# Patient Record
Sex: Female | Born: 1975 | Race: White | Hispanic: No | Smoking: Never smoker
Health system: Southern US, Community
[De-identification: ages and names within clinical notes are randomized; demographics above are authoritative.]

## PROBLEM LIST (undated history)

## (undated) DIAGNOSIS — C801 Malignant (primary) neoplasm, unspecified: Secondary | ICD-10-CM

## (undated) DIAGNOSIS — B009 Herpesviral infection, unspecified: Secondary | ICD-10-CM

## (undated) DIAGNOSIS — B019 Varicella without complication: Secondary | ICD-10-CM

## (undated) HISTORY — DX: Malignant (primary) neoplasm, unspecified: C80.1

## (undated) HISTORY — DX: Varicella without complication: B01.9

## (undated) HISTORY — DX: Herpesviral infection, unspecified: B00.9

---

## 2007-02-19 ENCOUNTER — Inpatient Hospital Stay (HOSPITAL_COMMUNITY): Admission: AD | Admit: 2007-02-19 | Discharge: 2007-02-22 | Payer: Self-pay | Admitting: Obstetrics and Gynecology

## 2007-12-22 DIAGNOSIS — C801 Malignant (primary) neoplasm, unspecified: Secondary | ICD-10-CM

## 2007-12-22 HISTORY — PX: LYMPH GLAND EXCISION: SHX13

## 2007-12-22 HISTORY — DX: Malignant (primary) neoplasm, unspecified: C80.1

## 2007-12-22 HISTORY — PX: MELANOMA EXCISION: SHX5266

## 2009-07-08 ENCOUNTER — Inpatient Hospital Stay (HOSPITAL_COMMUNITY): Admission: RE | Admit: 2009-07-08 | Discharge: 2009-07-10 | Payer: Self-pay | Admitting: Obstetrics & Gynecology

## 2009-07-08 ENCOUNTER — Encounter (INDEPENDENT_AMBULATORY_CARE_PROVIDER_SITE_OTHER): Payer: Self-pay | Admitting: Obstetrics and Gynecology

## 2011-03-29 LAB — CBC
HCT: 33.5 % — ABNORMAL LOW (ref 36.0–46.0)
HCT: 37.1 % (ref 36.0–46.0)
Hemoglobin: 13.3 g/dL (ref 12.0–15.0)
MCHC: 34 g/dL (ref 30.0–36.0)
MCHC: 35.7 g/dL (ref 30.0–36.0)
MCV: 92.6 fL (ref 78.0–100.0)
MCV: 94.3 fL (ref 78.0–100.0)
Platelets: 178 10*3/uL (ref 150–400)
RBC: 4.01 MIL/uL (ref 3.87–5.11)
RDW: 12.8 % (ref 11.5–15.5)
RDW: 12.8 % (ref 11.5–15.5)
WBC: 12.2 10*3/uL — ABNORMAL HIGH (ref 4.0–10.5)

## 2011-05-05 NOTE — H&P (Signed)
Linda Fleming, Linda Fleming                ACCOUNT NO.:  [id number]   MEDICAL RECORD NO.:  [id number]          PATIENT TYPE:  INP   LOCATION:  9131                          FACILITY:  [facility]   PHYSICIAN:  Crist Fat. Rivard, M.D. DATE OF BIRTH:  [birth year]   DATE OF ADMISSION:  07/08/2009  DATE OF DISCHARGE:                              HISTORY & PHYSICAL   Dr. Dois Davenport Rivard is the doctor of record.   ADMITTING DIAGNOSIS:  Intrauterine pregnancy at 40-1/7th week with  induction of labor.   A 35 year old gravida 2, para 1 with last menstrual period October 14, 2008, equals July 07, 2009; EDD per ultrasound equals July 21, 2009,  presents for induction of labor due to cervix being 3 cm dilated in the  office and postdates.  Prenatal care began with [facility]  beginning December 24, 2008.   ADMITTING DIAGNOSIS:  1. Intrauterine pregnancy at 40 weeks for induction of labor.  2. History of HSV-2.  No outbreaks of pregnancy, history of      cryosurgery in 2002, ear melanoma with skin graft in 2009.   LABORATORY DATA:  She is B positive.  Rh antibody negative.  VDRL and  RPR negative.  Rubella immune, HBsAg negative.  HIV nonreactive.  One-  hour test was 161.  Three-hour test was performed within normal limits.  Hemoglobin at 28 weeks was 10.8 and RPR was nonreactive.  She is group B  step negative.   PAST MEDICAL HISTORY:  No known drug allergies.  Denies asthma, heart  disease, or epilepsy.   PAST SURGICAL HISTORY:  She had cryosurgery in 2002 with a colpo in  2002, skin graft for cancer, left ear for melanoma.   PAST OBSTETRICS HISTORY:  In 2008, spontaneous vaginal delivery of a  viable female infant under epidural anesthesia at 37-5/7th, 7 pounds 4  ounce baby.  Had premature rupture of membranes at term and was induced.  The baby's name is [place].   PAST FAMILY HISTORY:  Aunt had varicose veins and maternal aunt had skin  cancer.   SOCIAL HISTORY:  She is married to Eugene Gavia, who is a Psychologist, occupational at  [employer].  She works as a Runner, broadcasting/film/video.  Denies alcohol, smoking, or drugs.  She  is taking prenatal vitamins.   PHYSICAL EXAMINATION:  HEART:  Regular without murmur.  LUNGS:  Clear bilaterally.  ABDOMEN:  Gravid.  Estimated fetal weight 7-8.5 pounds.  Fetal heart  tone 140-150, positive accelerations noted.  Irregular contractions per  monitor, Pitocin induction.  GENITOURINARY:  Vaginal exam, vertex 3 cm per RN.  Fetal heart rate  tracing is doing well.   ASSESSMENT:  Intrauterine pregnancy, labor induction, admitted to  Obstetrics Service of Dr. Estanislado Pandy.  Induction per protocol, pain  medication as needed.  Anticipate delivery and consult as needed.      Jasmine Awe, CNM      Dois Davenport A. Rivard, M.D.  Electronically Signed    JM/MEDQ  D:  07/08/2009  T:  07/09/2009  Job:  [id number]

## 2011-05-08 NOTE — H&P (Signed)
Linda Fleming, Linda Fleming            ACCOUNT NO.:  [id number]   MEDICAL RECORD NO.:  [id number]          PATIENT TYPE:  MAT   LOCATION:  MATC                          [facility]ACILITY:  [facility]   PHYSICIAN:  Naima A. Dillard, M.D. DATE OF BIRTH:  [birth year]   DATE OF ADMISSION:  02/19/2007  DATE OF DISCHARGE:                              HISTORY & PHYSICAL   This is a 35 year old gravida 1, para 0, at 41-3/7 weeks, who presents  with leaking fluid since 2:30 a.m. with onset of mild contractions this  morning.  Group B strep is negative.  Pregnancy has been followed by Dr.  Su Hilt and remarkable for:   1. History of abnormal Pap with cryosurgery.  2. HSV-2.  3. Abnormal Glucola with normal 3-hour GTT.   ALLERGIES:  None.   OBSTETRICAL HISTORY:  The patient is a primigravida.   MEDICAL HISTORY:  1. Abnormal Pap with cryosurgery in 2002.  2. History of HSV, for which she has been taking Valtrex.  3. Childhood varicella.   SURGICAL HISTORY:  Remarkable for wisdom teeth.   FAMILY HISTORY:  Remarkable for an aunt with varicosities, an aunt with  skin cancer.   GENETIC HISTORY:  Negative.   SOCIAL HISTORY:  The patient is married to Tommi Emery, who is involved  and supportive.  She is of the [place] faith.  She denies any alcohol,  tobacco or drug use.   PRENATAL LABORATORY DATA:  Hemoglobin 13, platelets 332.  Blood type B  positive, antibody screen negative.  RPR nonreactive.  Rubella immune.  Hepatitis negative.  HIV negative.  Cystic fibrosis negative.   HISTORY OF CURRENT PREGNANCY:  The patient entered care at 8 weeks'  gestation.  She had an ultrasound at 19 weeks, which was normal.  She  declined a quad screen.  She had a Glucola that was abnormal at 26 weeks  but had normal 3-hour GTT.  She had group B strep negative at term.   OBJECTIVE DATA:  VITAL SIGNS:  Stable, afebrile.  HEENT:  Within normal limits.  NECK:  Thyroid not enlarged.  CHEST:  Clear to auscultation.  CARDIAC:  Regular rate and rhythm.  ABDOMEN:  Gravid.  Vertex to Leopold's.  EFM shows reactive fetal heart  rate with contractions every 2-6 minutes.  PELVIC:  Positive pooling, positive ferning, and no HSV lesions.  Cervix  is 1-2, 70%, -3 station, vertex presentation.  EXTREMITIES:  Within normal limits.   ASSESSMENT:  1. Intrauterine pregnancy at 41-3/7 weeks.  2. Preterm rupture of membranes at post term.  3. Prodromal labor.   PLAN:  1. Admit to birthing suites per Dr. Normand Sloop.  2. Routine MD orders.  3. Patient prefers expectant management as husband is out of town      until 10 p.m. tonight.      Marie L. Williams, C.N.M.      Naima A. Normand Sloop, M.D.  Electronically Signed    MLW/MEDQ  D:  02/19/2007  T:  02/19/2007  Job:  [id number]

## 2012-01-30 DIAGNOSIS — B009 Herpesviral infection, unspecified: Secondary | ICD-10-CM | POA: Insufficient documentation

## 2012-08-11 ENCOUNTER — Other Ambulatory Visit: Payer: Self-pay | Admitting: Dermatology

## 2013-05-22 LAB — HM PAP SMEAR: HM PAP: NORMAL

## 2013-08-23 ENCOUNTER — Other Ambulatory Visit: Payer: Self-pay | Admitting: Dermatology

## 2014-03-09 ENCOUNTER — Encounter: Payer: Self-pay | Admitting: Internal Medicine

## 2014-03-09 ENCOUNTER — Ambulatory Visit (INDEPENDENT_AMBULATORY_CARE_PROVIDER_SITE_OTHER): Payer: BC Managed Care – PPO | Admitting: Internal Medicine

## 2014-03-09 VITALS — BP 104/72 | HR 68 | Temp 97.9°F | Ht 64.0 in | Wt 141.5 lb

## 2014-03-09 DIAGNOSIS — Z0000 Encounter for general adult medical examination without abnormal findings: Secondary | ICD-10-CM

## 2014-03-09 LAB — CBC
HEMATOCRIT: 40.2 % (ref 36.0–46.0)
Hemoglobin: 14.2 g/dL (ref 12.0–15.0)
MCH: 30.7 pg (ref 26.0–34.0)
MCHC: 35.3 g/dL (ref 30.0–36.0)
MCV: 86.8 fL (ref 78.0–100.0)
PLATELETS: 295 10*3/uL (ref 150–400)
RBC: 4.63 MIL/uL (ref 3.87–5.11)
RDW: 12.5 % (ref 11.5–15.5)
WBC: 6.8 10*3/uL (ref 4.0–10.5)

## 2014-03-09 LAB — COMPREHENSIVE METABOLIC PANEL
ALBUMIN: 4.7 g/dL (ref 3.5–5.2)
ALT: 11 U/L (ref 0–35)
AST: 16 U/L (ref 0–37)
Alkaline Phosphatase: 44 U/L (ref 39–117)
BUN: 16 mg/dL (ref 6–23)
CALCIUM: 9.6 mg/dL (ref 8.4–10.5)
CHLORIDE: 100 meq/L (ref 96–112)
CO2: 29 meq/L (ref 19–32)
CREATININE: 0.83 mg/dL (ref 0.50–1.10)
Glucose, Bld: 82 mg/dL (ref 70–99)
POTASSIUM: 4.5 meq/L (ref 3.5–5.3)
Sodium: 136 mEq/L (ref 135–145)
Total Bilirubin: 0.3 mg/dL (ref 0.2–1.2)
Total Protein: 7.1 g/dL (ref 6.0–8.3)

## 2014-03-09 LAB — LIPID PANEL
CHOL/HDL RATIO: 2.7 ratio
CHOLESTEROL: 181 mg/dL (ref 0–200)
HDL: 68 mg/dL (ref >39)
LDL Cholesterol: 99 mg/dL (ref 0–99)
Triglycerides: 72 mg/dL (ref <150)
VLDL: 14 mg/dL (ref 0–40)

## 2014-03-09 NOTE — Progress Notes (Signed)
HPI  Pt presents to the clinic today to establish care. She did not have a PCP prior. She does go to see the gyn. She has no concerns today.  Flu: 09/2013 Tetanus: within the last 10 years LMP: mirena Pap smear: 05/2013 Dentist: biannually  Past Medical History  Diagnosis Date  . Chicken pox   . Cancer 2009    Melanoma    Current Outpatient Prescriptions  Medication Sig Dispense Refill  . levonorgestrel (MIRENA) 20 MCG/24HR IUD 1 each by Intrauterine route once.       No current facility-administered medications for this visit.    No Known Allergies  Family History  Problem Relation Age of Onset  . Hyperlipidemia Mother   . Heart disease Maternal Grandfather   . Cancer Maternal Aunt     melanoma  . Diabetes Neg Hx   . Stroke Neg Hx     History   Social History  . Marital Status: Married    Spouse Name: N/A    Number of Children: N/A  . Years of Education: N/A   Occupational History  . Not on file.   Social History Main Topics  . Smoking status: Never Smoker   . Smokeless tobacco: Not on file  . Alcohol Use: 0.6 oz/week    1 Glasses of wine per week     Comment: occasional  . Drug Use: No  . Sexual Activity: Yes   Other Topics Concern  . Not on file   Social History Narrative  . No narrative on file    ROS:  Constitutional: Denies fever, malaise, fatigue, headache or abrupt weight changes.  HEENT: Denies eye pain, eye redness, ear pain, ringing in the ears, wax buildup, runny nose, nasal congestion, bloody nose, or sore throat. Respiratory: Denies difficulty breathing, shortness of breath, cough or sputum production.   Cardiovascular: Denies chest pain, chest tightness, palpitations or swelling in the hands or feet.  Gastrointestinal: Denies abdominal pain, bloating, constipation, diarrhea or blood in the stool.  GU: Denies frequency, urgency, pain with urination, blood in urine, odor or discharge. Musculoskeletal: Denies decrease in range of  motion, difficulty with gait, muscle pain or joint pain and swelling.  Skin: Denies redness, rashes, lesions or ulcercations.  Neurological: Denies dizziness, difficulty with memory, difficulty with speech or problems with balance and coordination.   No other specific complaints in a complete review of systems (except as listed in HPI above).  PE:  BP 104/72  Pulse 68  Temp(Src) 97.9 F (36.6 C) (Oral)  Ht 5\' 4"  (1.626 m)  Wt 141 lb 8 oz (64.184 kg)  BMI 24.28 kg/m2  SpO2 98% Wt Readings from Last 3 Encounters:  03/09/14 141 lb 8 oz (64.184 kg)    General: Appears her stated age, well developed, well nourished in NAD. HEENT: Head: normal shape and size; Eyes: sclera white, no icterus, conjunctiva pink, PERRLA and EOMs intact; Ears: Tm's gray and intact, normal light reflex; Nose: mucosa pink and moist, septum midline; Throat/Mouth: Teeth present, mucosa pink and moist, no lesions or ulcerations noted.  Neck: Normal range of motion. Neck supple, trachea midline. No massses, lumps or thyromegaly present.  Cardiovascular: Normal rate and rhythm. S1,S2 noted.  No murmur, rubs or gallops noted. No JVD or BLE edema. No carotid bruits noted. Pulmonary/Chest: Normal effort and positive vesicular breath sounds. No respiratory distress. No wheezes, rales or ronchi noted.  Abdomen: Soft and nontender. Normal bowel sounds, no bruits noted. No distention or masses noted. Liver,  spleen and kidneys non palpable. Musculoskeletal: Normal range of motion. No signs of joint swelling. No difficulty with gait.  Neurological: Alert and oriented. Cranial nerves II-XII intact. Coordination normal. +DTRs bilaterally. Psychiatric: Mood and affect normal. Behavior is normal. Judgment and thought content normal.   EKG:  BMET No results found for this basename: na, k, cl, co2, glucose, bun, creatinine, calcium, gfrnonaa, gfraa    Lipid Panel  No results found for this basename: chol, trig, hdl, cholhdl,  vldl, ldlcalc    CBC    Component Value Date/Time   WBC 12.2* 07/09/2009 0815   RBC 3.55* 07/09/2009 0815   HGB 11.4* 07/09/2009 0815   HCT 33.5* 07/09/2009 0815   PLT 178 07/09/2009 0815   MCV 94.3 07/09/2009 0815   MCHC 34.0 07/09/2009 0815   RDW 12.8 07/09/2009 0815    Hgb A1C No results found for this basename: HGBA1C     Assessment and Plan:  Preventative Health Maintenance:  All HM UTD Will obtain basic screening labs today  RTC in 1 year or sooner if needed

## 2014-03-09 NOTE — Patient Instructions (Addendum)

## 2014-03-09 NOTE — Progress Notes (Signed)
Pre visit review using our clinic review tool, if applicable. No additional management support is needed unless otherwise documented below in the visit note. 

## 2014-03-12 ENCOUNTER — Encounter: Payer: Self-pay | Admitting: *Deleted

## 2014-06-13 ENCOUNTER — Ambulatory Visit (INDEPENDENT_AMBULATORY_CARE_PROVIDER_SITE_OTHER): Payer: BC Managed Care – PPO | Admitting: Internal Medicine

## 2014-06-13 ENCOUNTER — Encounter (INDEPENDENT_AMBULATORY_CARE_PROVIDER_SITE_OTHER): Payer: Self-pay

## 2014-06-13 ENCOUNTER — Encounter: Payer: Self-pay | Admitting: Internal Medicine

## 2014-06-13 VITALS — BP 104/66 | HR 60 | Temp 98.0°F | Wt 137.0 lb

## 2014-06-13 DIAGNOSIS — R0982 Postnasal drip: Secondary | ICD-10-CM

## 2014-06-13 DIAGNOSIS — R059 Cough, unspecified: Secondary | ICD-10-CM

## 2014-06-13 DIAGNOSIS — R05 Cough: Secondary | ICD-10-CM

## 2014-06-13 MED ORDER — HYDROCODONE-HOMATROPINE 5-1.5 MG/5ML PO SYRP: 5.0000 mL | ORAL_SOLUTION | Freq: Three times a day (TID) | ORAL | Status: DC | PRN

## 2014-06-13 NOTE — Progress Notes (Signed)
HPI  Pt presents to the clinic today with c/o cough. This started 5 days ago. The cough is dry and non productive. The cough does seem to be worse at night. She denies runny nose, fever, chills or body aches. She has not tried anything OTC. She has no history of allergies or breathing problems. She has not had sick contacts that she is aware of.  Review of Systems      Past Medical History  Diagnosis Date  . Chicken pox   . Cancer 2009    Melanoma    Family History  Problem Relation Age of Onset  . Hyperlipidemia Mother   . Heart disease Maternal Grandfather   . Cancer Maternal Aunt     melanoma  . Diabetes Neg Hx   . Stroke Neg Hx     History   Social History  . Marital Status: Married    Spouse Name: N/A    Number of Children: N/A  . Years of Education: N/A   Occupational History  . Not on file.   Social History Main Topics  . Smoking status: Never Smoker   . Smokeless tobacco: Not on file  . Alcohol Use: 0.6 oz/week    1 Glasses of wine per week     Comment: occasional  . Drug Use: No  . Sexual Activity: Yes   Other Topics Concern  . Not on file   Social History Narrative  . No narrative on file    No Known Allergies   Constitutional:  Denies headache, fatigue, fever or abrupt weight changes.  HEENT:  Denies eye redness, eye pain, pressure behind the eyes, facial pain, nasal congestion, ear pain, ringing in the ears, wax buildup, runny nose or bloody nose. Respiratory: Positive cough. Denies difficulty breathing or shortness of breath.  Cardiovascular: Denies chest pain, chest tightness, palpitations or swelling in the hands or feet.   No other specific complaints in a complete review of systems (except as listed in HPI above).  Objective:   BP 104/66  Pulse 60  Temp(Src) 98 F (36.7 C) (Oral)  Wt 137 lb (62.143 kg)  SpO2 98% Wt Readings from Last 3 Encounters:  06/13/14 137 lb (62.143 kg)  03/09/14 141 lb 8 oz (64.184 kg)     General:  Appears her stated age, well developed, well nourished in NAD. HEENT: Head: normal shape and size; Eyes: sclera white, no icterus, conjunctiva pink, PERRLA and EOMs intact; Ears: Tm's gray and intact, normal light reflex; Nose: mucosa pink and moist, septum midline; Throat/Mouth: + PND. Teeth present, mucosa pink and moist, no exudate noted, no lesions or ulcerations noted.  Neck: Neck supple, trachea midline. No massses, lumps or thyromegaly present.  Cardiovascular: Normal rate and rhythm. S1,S2 noted.  No murmur, rubs or gallops noted. No JVD or BLE edema. No carotid bruits noted. Pulmonary/Chest: Normal effort and positive vesicular breath sounds. No respiratory distress. No wheezes, rales or ronchi noted.      Assessment & Plan:   Cough secondary to post nasal drip:  Get some rest and drink plenty of water Try some Allegra and Flonase OTC RX given for Hycodan cough syrup   RTC as needed or if symptoms persist.

## 2014-06-13 NOTE — Patient Instructions (Addendum)
Cough, Adult  A cough is a reflex that helps clear your throat and airways. It can help heal the body or may be a reaction to an irritated airway. A cough may only last 2 or 3 weeks (acute) or may last more than 8 weeks (chronic).  CAUSES Acute cough:  Viral or bacterial infections. Chronic cough:  Infections.  Allergies.  Asthma.  Post-nasal drip.  Smoking.  Heartburn or acid reflux.  Some medicines.  Chronic lung problems (COPD).  Cancer. SYMPTOMS   Cough.  Fever.  Chest pain.  Increased breathing rate.  High-pitched whistling sound when breathing (wheezing).  Colored mucus that you cough up (sputum). TREATMENT   A bacterial cough may be treated with antibiotic medicine.  A viral cough must run its course and will not respond to antibiotics.  Your caregiver may recommend other treatments if you have a chronic cough. HOME CARE INSTRUCTIONS   Only take over-the-counter or prescription medicines for pain, discomfort, or fever as directed by your caregiver. Use cough suppressants only as directed by your caregiver.  Use a cold steam vaporizer or humidifier in your bedroom or home to help loosen secretions.  Sleep in a semi-upright position if your cough is worse at night.  Rest as needed.  Stop smoking if you smoke. SEEK IMMEDIATE MEDICAL CARE IF:   You have pus in your sputum.  Your cough starts to worsen.  You cannot control your cough with suppressants and are losing sleep.  You begin coughing up blood.  You have difficulty breathing.  You develop pain which is getting worse or is uncontrolled with medicine.  You have a fever. MAKE SURE YOU:   Understand these instructions.  Will watch your condition.  Will get help right away if you are not doing well or get worse. Document Released: 06/05/2011 Document Revised: 02/29/2012 Document Reviewed: 06/05/2011 ExitCare Patient Information 2015 [employer]. This information is not intended  to replace advice given to you by your health care provider. Make sure you discuss any questions you have with your health care provider.  

## 2014-06-13 NOTE — Progress Notes (Signed)
Pre visit review using our clinic review tool, if applicable. No additional management support is needed unless otherwise documented below in the visit note. 

## 2014-06-14 ENCOUNTER — Ambulatory Visit: Payer: BC Managed Care – PPO | Admitting: Internal Medicine

## 2014-08-29 ENCOUNTER — Other Ambulatory Visit: Payer: Self-pay | Admitting: Dermatology

## 2014-11-02 ENCOUNTER — Ambulatory Visit (INDEPENDENT_AMBULATORY_CARE_PROVIDER_SITE_OTHER): Payer: BC Managed Care – PPO | Admitting: Family Medicine

## 2014-11-02 ENCOUNTER — Encounter: Payer: Self-pay | Admitting: Family Medicine

## 2014-11-02 VITALS — BP 98/66 | HR 64 | Temp 98.2°F | Resp 16 | Wt 144.5 lb

## 2014-11-02 DIAGNOSIS — N3000 Acute cystitis without hematuria: Secondary | ICD-10-CM

## 2014-11-02 DIAGNOSIS — R300 Dysuria: Secondary | ICD-10-CM

## 2014-11-02 LAB — POCT URINALYSIS DIPSTICK
BILIRUBIN UA: NEGATIVE
Blood, UA: NEGATIVE
GLUCOSE UA: NEGATIVE
KETONES UA: NEGATIVE
Nitrite, UA: NEGATIVE
PROTEIN UA: NEGATIVE
SPEC GRAV UA: 1.02
Urobilinogen, UA: 0.2
pH, UA: 6

## 2014-11-02 MED ORDER — SULFAMETHOXAZOLE-TRIMETHOPRIM 800-160 MG PO TABS: 1.0000 | ORAL_TABLET | Freq: Two times a day (BID) | ORAL | Status: DC

## 2014-11-02 NOTE — Patient Instructions (Signed)
Drink plenty of water and start the antibiotics today.  We'll contact you with your lab report.  Take care.   Try to get some rest.

## 2014-11-02 NOTE — Progress Notes (Signed)
Pre visit review using our clinic review tool, if applicable. No additional management support is needed unless otherwise documented below in the visit note.  Dysuria: noted change in odor, some burning, last few days.   abdominal pain: occ lower abd pain Fevers: no back pain: no Vomiting: no  Recent URI sx.  Voice is affected.  Cold started about 1 week ago.  Thick nasal discharge, facial pain.  No ear pain.  Some ST initially, but not now.   Some clicking in the L ear with swallowing.  Some cough, some sputum.    Meds, vitals, and allergies reviewed.   ROS: See HPI.  Otherwise negative.    GEN: nad, alert and oriented HEENT: mucous membranes moist, TM wnl, normal TM movement on valsalva, sinuses not ttp, OP with minimal cobblestoning.  NECK: supple CV: rrr.  PULM: ctab, no inc wob ABD: soft, +bs, suprapubic area mildly tender EXT: no edema SKIN: no acute rash BACK: no CVA pain

## 2014-11-04 DIAGNOSIS — N390 Urinary tract infection, site not specified: Secondary | ICD-10-CM | POA: Insufficient documentation

## 2014-11-04 NOTE — Assessment & Plan Note (Signed)
Nontoxic, septra, ucx, fluids, f/u prn.  Also concurrent URI, nontoxic, supportive care; will be on septra for UTI.   Await culture.   D/w pt.  She agrees.

## 2014-11-05 LAB — URINE CULTURE

## 2015-02-12 ENCOUNTER — Ambulatory Visit (INDEPENDENT_AMBULATORY_CARE_PROVIDER_SITE_OTHER): Payer: BC Managed Care – PPO | Admitting: Internal Medicine

## 2015-02-12 ENCOUNTER — Encounter: Payer: Self-pay | Admitting: Internal Medicine

## 2015-02-12 VITALS — BP 102/60 | HR 65 | Temp 98.4°F | Wt 142.0 lb

## 2015-02-12 DIAGNOSIS — A499 Bacterial infection, unspecified: Secondary | ICD-10-CM

## 2015-02-12 DIAGNOSIS — B373 Candidiasis of vulva and vagina: Secondary | ICD-10-CM

## 2015-02-12 DIAGNOSIS — B9689 Other specified bacterial agents as the cause of diseases classified elsewhere: Secondary | ICD-10-CM

## 2015-02-12 DIAGNOSIS — B3731 Acute candidiasis of vulva and vagina: Secondary | ICD-10-CM

## 2015-02-12 DIAGNOSIS — N760 Acute vaginitis: Secondary | ICD-10-CM

## 2015-02-12 MED ORDER — FLUCONAZOLE 150 MG PO TABS
150.0000 mg | ORAL_TABLET | Freq: Once | ORAL | Status: DC
Start: 2015-02-12 — End: 2015-03-12

## 2015-02-12 MED ORDER — METRONIDAZOLE 500 MG PO TABS: 500.0000 mg | ORAL_TABLET | Freq: Three times a day (TID) | ORAL | Status: DC

## 2015-02-12 NOTE — Patient Instructions (Signed)
Bacterial Vaginosis Bacterial vaginosis is a vaginal infection that occurs when the normal balance of bacteria in the vagina is disrupted. It results from an overgrowth of certain bacteria. This is the most common vaginal infection in women of childbearing age. Treatment is important to prevent complications, especially in pregnant women, as it can cause a premature delivery. CAUSES  Bacterial vaginosis is caused by an increase in harmful bacteria that are normally present in smaller amounts in the vagina. Several different kinds of bacteria can cause bacterial vaginosis. However, the reason that the condition develops is not fully understood. RISK FACTORS Certain activities or behaviors can put you at an increased risk of developing bacterial vaginosis, including:  Having a new sex partner or multiple sex partners.  Douching.  Using an intrauterine device (IUD) for contraception. Women do not get bacterial vaginosis from toilet seats, bedding, swimming pools, or contact with objects around them. SIGNS AND SYMPTOMS  Some women with bacterial vaginosis have no signs or symptoms. Common symptoms include:  Grey vaginal discharge.  A fishlike odor with discharge, especially after sexual intercourse.  Itching or burning of the vagina and vulva.  Burning or pain with urination. DIAGNOSIS  Your health care provider will take a medical history and examine the vagina for signs of bacterial vaginosis. A sample of vaginal fluid may be taken. Your health care provider will look at this sample under a microscope to check for bacteria and abnormal cells. A vaginal pH test may also be done.  TREATMENT  Bacterial vaginosis may be treated with antibiotic medicines. These may be given in the form of a pill or a vaginal cream. A second round of antibiotics may be prescribed if the condition comes back after treatment.  HOME CARE INSTRUCTIONS   Only take over-the-counter or prescription medicines as  directed by your health care provider.  If antibiotic medicine was prescribed, take it as directed. Make sure you finish it even if you start to feel better.  Do not have sex until treatment is completed.  Tell all sexual partners that you have a vaginal infection. They should see their health care provider and be treated if they have problems, such as a mild rash or itching.  Practice safe sex by using condoms and only having one sex partner. SEEK MEDICAL CARE IF:   Your symptoms are not improving after 3 days of treatment.  You have increased discharge or pain.  You have a fever. MAKE SURE YOU:   Understand these instructions.  Will watch your condition.  Will get help right away if you are not doing well or get worse. FOR MORE INFORMATION  Centers for Disease Control and Prevention, Division of STD Prevention: www.cdc.gov/std American Sexual Health Association (ASHA): www.ashastd.org  Document Released: 12/07/2005 Document Revised: 09/27/2013 Document Reviewed: 07/19/2013 ExitCare Patient Information 2015 [employer]. This information is not intended to replace advice given to you by your health care provider. Make sure you discuss any questions you have with your health care provider.  

## 2015-02-12 NOTE — Progress Notes (Signed)
Pre visit review using our clinic review tool, if applicable. No additional management support is needed unless otherwise documented below in the visit note. 

## 2015-02-12 NOTE — Progress Notes (Signed)
Subjective:    Patient ID: Linda Fleming, female    DOB: 12/30/1975, 39 y.o.   MRN: [id number]  HPI  Pt presents to the clinic today with c/o vaginal discharge. She reports this started 1 week ago. She describes the discharge as thin and white with a slight odor. She denies itching, burning or abnormal bleeding. She has not tried anything OTC. She has had BV in the past and things this is similar. She is sexually active. She has an IUD.  Review of Systems      Past Medical History  Diagnosis Date  . Chicken pox   . Cancer 2009    Melanoma    Current Outpatient Prescriptions  Medication Sig Dispense Refill  . acyclovir (ZOVIRAX) 400 MG tablet     . levonorgestrel (MIRENA) 20 MCG/24HR IUD 1 each by Intrauterine route once.     No current facility-administered medications for this visit.    No Known Allergies  Family History  Problem Relation Age of Onset  . Hyperlipidemia Mother   . Heart disease Maternal Grandfather   . Cancer Maternal Aunt     melanoma  . Diabetes Neg Hx   . Stroke Neg Hx     History   Social History  . Marital Status: Married    Spouse Name: N/A  . Number of Children: N/A  . Years of Education: N/A   Occupational History  . Not on file.   Social History Main Topics  . Smoking status: Never Smoker   . Smokeless tobacco: Not on file  . Alcohol Use: 0.6 oz/week    1 Glasses of wine per week     Comment: occasional  . Drug Use: No  . Sexual Activity: Yes   Other Topics Concern  . Not on file   Social History Narrative     Constitutional: Denies fever, malaise, fatigue, headache or abrupt weight changes.  Respiratory: Denies difficulty breathing, shortness of breath, cough or sputum production.   Cardiovascular: Denies chest pain, chest tightness, palpitations or swelling in the hands or feet.  Gastrointestinal: Denies abdominal pain, bloating, constipation, diarrhea or blood in the stool.  GU: Pt reports vaginal discharge. Denies  urgency, frequency, pain with urination, burning sensation, blood in urine. Skin: Denies redness, rashes, lesions or ulcercations.   No other specific complaints in a complete review of systems (except as listed in HPI above).  Objective:   Physical Exam  BP 102/60 mmHg  Pulse 65  Temp(Src) 98.4 F (36.9 C) (Oral)  Wt 142 lb (64.411 kg)  SpO2 99% Wt Readings from Last 3 Encounters:  02/12/15 142 lb (64.411 kg)  11/02/14 144 lb 8 oz (65.545 kg)  06/13/14 137 lb (62.143 kg)    General: Appears her stated age, well developed, well nourished in NAD. Skin: Warm, dry and intact. No rashes, lesions or ulcerations noted. Cardiovascular: Normal rate and rhythm. S1,S2 noted.  No murmur, rubs or gallops noted. Pulmonary/Chest: Normal effort and positive vesicular breath sounds. No respiratory distress. No wheezes, rales or ronchi noted.  Abdomen: Soft and nontender. Normal bowel sounds, no bruits noted. No distention or masses noted. Liver, spleen and kidneys non palpable. Pelvic: Normal female anatomy. Small amount of thin white discharge noted at vaginal opening. + odor.  BMET    Component Value Date/Time   NA 136 03/09/2014 1545   K 4.5 03/09/2014 1545   CL 100 03/09/2014 1545   CO2 29 03/09/2014 1545   GLUCOSE 82 03/09/2014 1545  BUN 16 03/09/2014 1545   CREATININE 0.83 03/09/2014 1545   CALCIUM 9.6 03/09/2014 1545    Lipid Panel     Component Value Date/Time   CHOL 181 03/09/2014 1545   TRIG 72 03/09/2014 1545   HDL 68 03/09/2014 1545   CHOLHDL 2.7 03/09/2014 1545   VLDL 14 03/09/2014 1545   LDLCALC 99 03/09/2014 1545    CBC    Component Value Date/Time   WBC 6.8 03/09/2014 1545   RBC 4.63 03/09/2014 1545   HGB 14.2 03/09/2014 1545   HCT 40.2 03/09/2014 1545   PLT 295 03/09/2014 1545   MCV 86.8 03/09/2014 1545   MCH 30.7 03/09/2014 1545   MCHC 35.3 03/09/2014 1545   RDW 12.5 03/09/2014 1545    Hgb A1C No results found for: HGBA1C      Assessment &  Plan:   Vaginal Discharge secondary to BV and Candida Vaginitis:  Wet prep: + whiff, + clue cells, + yeast eRx for Flagyl 500 mg BID x 7 days- advised her to abstain from alcohol eRx for Diflucan 150 mg PO x 1  RTC as needed or if symptoms persist or worsen

## 2015-03-12 ENCOUNTER — Encounter: Payer: Self-pay | Admitting: Internal Medicine

## 2015-03-12 ENCOUNTER — Ambulatory Visit (INDEPENDENT_AMBULATORY_CARE_PROVIDER_SITE_OTHER): Payer: BC Managed Care – PPO | Admitting: Internal Medicine

## 2015-03-12 VITALS — BP 106/68 | HR 67 | Temp 98.0°F | Wt 143.0 lb

## 2015-03-12 DIAGNOSIS — A499 Bacterial infection, unspecified: Secondary | ICD-10-CM

## 2015-03-12 DIAGNOSIS — N760 Acute vaginitis: Secondary | ICD-10-CM

## 2015-03-12 DIAGNOSIS — B9689 Other specified bacterial agents as the cause of diseases classified elsewhere: Secondary | ICD-10-CM

## 2015-03-12 MED ORDER — METRONIDAZOLE 500 MG PO TABS: 500.0000 mg | ORAL_TABLET | Freq: Two times a day (BID) | ORAL | Status: DC

## 2015-03-12 NOTE — Progress Notes (Signed)
Subjective:    Patient ID: Linda Fleming, female    DOB: [birth year], 39 y.o.   MRN: [id number]  HPI  Pt presents to the clinic today with c/o vaginal discharge and odor. She reports this started yesterday. The discharge is thin and white. She denies vaginal itching. She is sexually active. She was seen 1 month ago for the same and diagnosed with BV and a yeast infection. Her symptoms at that time did go away with the treatment provided.   Review of Systems  Past Medical History  Diagnosis Date  . Chicken pox   . Cancer 2009    Melanoma    Current Outpatient Prescriptions  Medication Sig Dispense Refill  . levonorgestrel (MIRENA) 20 MCG/24HR IUD 1 each by Intrauterine route once.     No current facility-administered medications for this visit.    No Known Allergies  Family History  Problem Relation Age of Onset  . Hyperlipidemia Mother   . Heart disease Maternal Grandfather   . Cancer Maternal Aunt     melanoma  . Diabetes Neg Hx   . Stroke Neg Hx     History   Social History  . Marital Status: Married    Spouse Name: N/A  . Number of Children: N/A  . Years of Education: N/A   Occupational History  . Not on file.   Social History Main Topics  . Smoking status: Never Smoker   . Smokeless tobacco: Not on file  . Alcohol Use: 0.6 oz/week    1 Glasses of wine per week     Comment: occasional  . Drug Use: No  . Sexual Activity: Yes   Other Topics Concern  . Not on file   Social History Narrative     Constitutional: Denies fever, malaise, fatigue, headache or abrupt weight changes.  Respiratory: Denies difficulty breathing, shortness of breath, cough or sputum production.   Cardiovascular: Denies chest pain, chest tightness, palpitations or swelling in the hands or feet.  Gastrointestinal: Denies abdominal pain, bloating, constipation, diarrhea or blood in the stool.  GU: Pt reports vaginal discharge and odor. Denies urgency, frequency, pain with  urination, burning sensation, blood in urine.   No other specific complaints in a complete review of systems (except as listed in HPI above).     Objective:   Physical Exam   BP 106/68 mmHg  Pulse 67  Temp(Src) 98 F (36.7 C) (Oral)  Wt 143 lb (64.864 kg)  SpO2 99% Wt Readings from Last 3 Encounters:  03/12/15 143 lb (64.864 kg)  02/12/15 142 lb (64.411 kg)  11/02/14 144 lb 8 oz (65.545 kg)    General: Appears herstated age, well developed, well nourished in NAD. Cardiovascular: Normal rate and rhythm. S1,S2 noted.  No murmur, rubs or gallops noted. Pulmonary/Chest: Normal effort and positive vesicular breath sounds. No respiratory distress. No wheezes, rales or ronchi noted.  Abdomen: Soft and nontender. Normal bowel sounds, no bruits noted. No distention or masses noted. Liver, spleen and kidneys non palpable. Pelvic: Normal female anatomy. Small amount of discharge noted at vaginal opening.  BMET    Component Value Date/Time   NA 136 03/09/2014 1545   K 4.5 03/09/2014 1545   CL 100 03/09/2014 1545   CO2 29 03/09/2014 1545   GLUCOSE 82 03/09/2014 1545   BUN 16 03/09/2014 1545   CREATININE 0.83 03/09/2014 1545   CALCIUM 9.6 03/09/2014 1545    Lipid Panel     Component Value Date/Time  CHOL 181 03/09/2014 1545   TRIG 72 03/09/2014 1545   HDL 68 03/09/2014 1545   CHOLHDL 2.7 03/09/2014 1545   VLDL 14 03/09/2014 1545   LDLCALC 99 03/09/2014 1545    CBC    Component Value Date/Time   WBC 6.8 03/09/2014 1545   RBC 4.63 03/09/2014 1545   HGB 14.2 03/09/2014 1545   HCT 40.2 03/09/2014 1545   PLT 295 03/09/2014 1545   MCV 86.8 03/09/2014 1545   MCH 30.7 03/09/2014 1545   MCHC 35.3 03/09/2014 1545   RDW 12.5 03/09/2014 1545    Hgb A1C No results found for: HGBA1C      Assessment & Plan:   Recurrent Bacterial Vagonosis:  Wet prep: + clue cells, no yeast, no trich Rx for Flagyl 500 mg BID x 14 days Will put refills on it for her as this is a  recurrent issue Advised her to avoid alcohol while on the medication  RTC as needed or if symptoms persist or worsen

## 2015-03-12 NOTE — Patient Instructions (Signed)
Bacterial Vaginosis Bacterial vaginosis is a vaginal infection that occurs when the normal balance of bacteria in the vagina is disrupted. It results from an overgrowth of certain bacteria. This is the most common vaginal infection in women of childbearing age. Treatment is important to prevent complications, especially in pregnant women, as it can cause a premature delivery. CAUSES  Bacterial vaginosis is caused by an increase in harmful bacteria that are normally present in smaller amounts in the vagina. Several different kinds of bacteria can cause bacterial vaginosis. However, the reason that the condition develops is not fully understood. RISK FACTORS Certain activities or behaviors can put you at an increased risk of developing bacterial vaginosis, including:  Having a new sex partner or multiple sex partners.  Douching.  Using an intrauterine device (IUD) for contraception. Women do not get bacterial vaginosis from toilet seats, bedding, swimming pools, or contact with objects around them. SIGNS AND SYMPTOMS  Some women with bacterial vaginosis have no signs or symptoms. Common symptoms include:  Grey vaginal discharge.  A fishlike odor with discharge, especially after sexual intercourse.  Itching or burning of the vagina and vulva.  Burning or pain with urination. DIAGNOSIS  Your health care provider will take a medical history and examine the vagina for signs of bacterial vaginosis. A sample of vaginal fluid may be taken. Your health care provider will look at this sample under a microscope to check for bacteria and abnormal cells. A vaginal pH test may also be done.  TREATMENT  Bacterial vaginosis may be treated with antibiotic medicines. These may be given in the form of a pill or a vaginal cream. A second round of antibiotics may be prescribed if the condition comes back after treatment.  HOME CARE INSTRUCTIONS   Only take over-the-counter or prescription medicines as  directed by your health care provider.  If antibiotic medicine was prescribed, take it as directed. Make sure you finish it even if you start to feel better.  Do not have sex until treatment is completed.  Tell all sexual partners that you have a vaginal infection. They should see their health care provider and be treated if they have problems, such as a mild rash or itching.  Practice safe sex by using condoms and only having one sex partner. SEEK MEDICAL CARE IF:   Your symptoms are not improving after 3 days of treatment.  You have increased discharge or pain.  You have a fever. MAKE SURE YOU:   Understand these instructions.  Will watch your condition.  Will get help right away if you are not doing well or get worse. FOR MORE INFORMATION  Centers for Disease Control and Prevention, Division of STD Prevention: www.cdc.gov/std American Sexual Health Association (ASHA): www.ashastd.org  Document Released: 12/07/2005 Document Revised: 09/27/2013 Document Reviewed: 07/19/2013 ExitCare Patient Information 2015 [employer]. This information is not intended to replace advice given to you by your health care provider. Make sure you discuss any questions you have with your health care provider.  

## 2015-03-12 NOTE — Progress Notes (Signed)
Pre visit review using our clinic review tool, if applicable. No additional management support is needed unless otherwise documented below in the visit note. 

## 2015-05-23 ENCOUNTER — Other Ambulatory Visit: Payer: Self-pay | Admitting: Family Medicine

## 2015-06-28 ENCOUNTER — Other Ambulatory Visit: Payer: Self-pay | Admitting: Internal Medicine

## 2015-07-01 NOTE — Telephone Encounter (Signed)
Last filled 03/12/2015 with 2 refills--please advise or does pt need to come for an OV

## 2015-09-16 ENCOUNTER — Telehealth: Payer: Self-pay | Admitting: Internal Medicine

## 2015-09-16 NOTE — Telephone Encounter (Signed)
Rec'd from [facility] forward 6 pages to Dr. Garnette Gunner

## 2015-10-25 ENCOUNTER — Encounter: Payer: Self-pay | Admitting: Family Medicine

## 2015-10-25 ENCOUNTER — Ambulatory Visit (INDEPENDENT_AMBULATORY_CARE_PROVIDER_SITE_OTHER): Payer: BC Managed Care – PPO | Admitting: Family Medicine

## 2015-10-25 VITALS — BP 108/64 | HR 61 | Temp 98.3°F | Wt 141.8 lb

## 2015-10-25 DIAGNOSIS — R300 Dysuria: Secondary | ICD-10-CM | POA: Diagnosis not present

## 2015-10-25 LAB — POCT URINALYSIS DIPSTICK
BILIRUBIN UA: NEGATIVE
GLUCOSE UA: NEGATIVE
Ketones, UA: NEGATIVE
NITRITE UA: NEGATIVE
PH UA: 5
Protein, UA: NEGATIVE
RBC UA: NEGATIVE
SPEC GRAV UA: 1.01
Urobilinogen, UA: 0.2

## 2015-10-25 LAB — URINALYSIS, MICROSCOPIC ONLY: RBC / HPF: NONE SEEN (ref 0–?)

## 2015-10-25 MED ORDER — CEPHALEXIN 500 MG PO CAPS: 500.0000 mg | ORAL_CAPSULE | Freq: Two times a day (BID) | ORAL | Status: DC

## 2015-10-25 NOTE — Progress Notes (Signed)
Pre visit review using our clinic review tool, if applicable. No additional management support is needed unless otherwise documented below in the visit note. 

## 2015-10-25 NOTE — Assessment & Plan Note (Signed)
History and UA consistent with UTI. Patient is well-appearing and vitals are stable. We'll treat with Keflex. We'll send for culture and micro-. She is given return precautions.

## 2015-10-25 NOTE — Patient Instructions (Signed)
Nice to meet you. You have a UTI. We will send this for culture to confirm.  We will treat you with keflex for this.  If you develop abdominal pain, nausea, vomiting, diarrhea, fever, back pain, or new or worsening symptoms please seek medical attention.

## 2015-10-25 NOTE — Progress Notes (Signed)
Patient ID: Linda Fleming, female   DOB: 29-Apr-1976, 39 y.o.   MRN: [id number]  Tommi Rumps, MD Phone: [phone]  Linda Fleming is a 39 y.o. female who presents today for same day visit.   Dysuria: Patient notes since Sunday she's had burning with urination and odor to her urine. She notes some urgency and frequency. She denies any hematuria, abdominal pain, fever, vaginal discharge, and vaginal bleeding. No back pain with this. She notes this feels like her prior UTIs. She feels well at this time.  PMH: nonsmoker. Has history of prior UTI with Citrobacter.   ROS see history of present illness  Objective  Physical Exam Filed Vitals:   10/25/15 1410  BP: 108/64  Pulse: 61  Temp: 98.3 F (36.8 C)    Physical Exam  Constitutional: She is well-developed, well-nourished, and in no distress.  HENT:  Head: Normocephalic and atraumatic.  Cardiovascular: Normal rate, regular rhythm and normal heart sounds.  Exam reveals no gallop and no friction rub.   No murmur heard. Pulmonary/Chest: Effort normal and breath sounds normal. No respiratory distress. She has no wheezes. She has no rales.  Abdominal: Soft. Bowel sounds are normal. She exhibits no distension. There is no tenderness. There is no rebound and no guarding.  Skin: Skin is warm and dry. She is not diaphoretic.     Assessment/Plan: Please see individual problem list.  UTI (urinary tract infection) History and UA consistent with UTI. Patient is well-appearing and vitals are stable. We'll treat with Keflex. We'll send for culture and micro-. She is given return precautions.    Orders Placed This Encounter  Procedures  . Urine Culture  . Urine Microscopic Only  . POCT Urinalysis Dipstick    Meds ordered this encounter  Medications  . cephALEXin (KEFLEX) 500 MG capsule    Sig: Take 1 capsule (500 mg total) by mouth 2 (two) times daily.    Dispense:  14 capsule    Refill:  0    Tommi Rumps

## 2015-10-27 LAB — URINE CULTURE

## 2016-06-29 ENCOUNTER — Encounter: Payer: Self-pay | Admitting: Internal Medicine

## 2016-06-29 ENCOUNTER — Ambulatory Visit (INDEPENDENT_AMBULATORY_CARE_PROVIDER_SITE_OTHER): Payer: BC Managed Care – PPO | Admitting: Internal Medicine

## 2016-06-29 VITALS — BP 104/62 | HR 66 | Temp 98.7°F | Wt 144.0 lb

## 2016-06-29 DIAGNOSIS — L739 Follicular disorder, unspecified: Secondary | ICD-10-CM | POA: Diagnosis not present

## 2016-06-29 NOTE — Progress Notes (Signed)
Pre visit review using our clinic review tool, if applicable. No additional management support is needed unless otherwise documented below in the visit note. 

## 2016-06-29 NOTE — Patient Instructions (Signed)
Folliculitis °Folliculitis is redness, soreness, and swelling (inflammation) of the hair follicles. This condition can occur anywhere on the body. People with weakened immune systems, diabetes, or obesity have a greater risk of getting folliculitis. °CAUSES °· Bacterial infection. This is the most common cause. °· Fungal infection. °· Viral infection. °· Contact with certain chemicals, especially oils and tars. °Long-term folliculitis can result from bacteria that live in the nostrils. The bacteria may trigger multiple outbreaks of folliculitis over time. °SYMPTOMS °Folliculitis most commonly occurs on the scalp, thighs, legs, back, buttocks, and areas where hair is shaved frequently. An early sign of folliculitis is a small, white or yellow, pus-filled, itchy lesion (pustule). These lesions appear on a red, inflamed follicle. They are usually less than 0.2 inches (5 mm) wide. When there is an infection of the follicle that goes deeper, it becomes a boil or furuncle. A group of closely packed boils creates a larger lesion (carbuncle). Carbuncles tend to occur in hairy, sweaty areas of the body. °DIAGNOSIS  °Your caregiver can usually tell what is wrong by doing a physical exam. A sample may be taken from one of the lesions and tested in a lab. This can help determine what is causing your folliculitis. °TREATMENT  °Treatment may include: °· Applying warm compresses to the affected areas. °· Taking antibiotic medicines orally or applying them to the skin. °· Draining the lesions if they contain a large amount of pus or fluid. °· Laser hair removal for cases of long-lasting folliculitis. This helps to prevent regrowth of the hair. °HOME CARE INSTRUCTIONS °· Apply warm compresses to the affected areas as directed by your caregiver. °· If antibiotics are prescribed, take them as directed. Finish them even if you start to feel better. °· You may take over-the-counter medicines to relieve itching. °· Do not shave irritated  skin. °· Follow up with your caregiver as directed. °SEEK IMMEDIATE MEDICAL CARE IF:  °· You have increasing redness, swelling, or pain in the affected area. °· You have a fever. °MAKE SURE YOU: °· Understand these instructions. °· Will watch your condition. °· Will get help right away if you are not doing well or get worse. °  °This information is not intended to replace advice given to you by your health care provider. Make sure you discuss any questions you have with your health care provider. °  °Document Released: 02/15/2002 Document Revised: 12/28/2014 Document Reviewed: 03/08/2012 °Elsevier Interactive Patient Education ©2016 [employer] ° °

## 2016-06-29 NOTE — Progress Notes (Signed)
Subjective:    Patient ID: Linda Fleming, female    DOB: [birth year], 40 y.o.   MRN: [id number]  HPI  Pt presents to the clinic today with c/o a bump in her vaginal area. She noticed this 2 weeks ago. The bump has not changed in size. The area is not tender or warm to touch. She has not noticed any drainage from the area. She has not tried anything OTC for this. 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  Review of Systems      Past Medical History  Diagnosis Date  . Chicken pox   . Cancer [facility]) 2009    Melanoma    Current Outpatient Prescriptions  Medication Sig Dispense Refill  . cephALEXin (KEFLEX) 500 MG capsule Take 1 capsule (500 mg total) by mouth 2 (two) times daily. 14 capsule 0  . levonorgestrel (MIRENA) 20 MCG/24HR IUD 1 each by Intrauterine route once.    . metroNIDAZOLE (FLAGYL) 500 MG tablet TAKE 1 TABLET BY MOUTH TWICE A DAY 14 tablet 2   No current facility-administered medications for this visit.    No Known Allergies  Family History  Problem Relation Age of Onset  . Hyperlipidemia Mother   . Heart disease Maternal Grandfather   . Cancer Maternal Aunt     melanoma  . Diabetes Neg Hx   . Stroke Neg Hx     Social History   Social History  . Marital Status: Married    Spouse Name: N/A  . Number of Children: N/A  . Years of Education: N/A   Occupational History  . Not on file.   Social History Main Topics  . Smoking status: Never Smoker   . Smokeless tobacco: Not on file  . Alcohol Use: 0.6 oz/week    1 Glasses of wine per week     Comment: occasional  . Drug Use: No  . Sexual Activity: Yes   Other Topics Concern  . Not on file   Social History Narrative     Constitutional: Denies fever, malaise, fatigue, headache or abrupt weight changes.  Gastrointestinal: Denies abdominal pain, bloating, constipation, diarrhea or blood in the stool.  GU: Denies urgency, frequency, pain with urination, burning sensation,  blood in urine, odor or discharge. Skin: Pt reports bump in her vaginal area. Denies redness, rashes,or ulcercations.   No other specific complaints in a complete review of systems (except as listed in HPI above).  Objective:   Physical Exam  BP 104/62 mmHg  Pulse 66  Temp(Src) 98.7 F (37.1 C) (Oral)  Wt 144 lb (65.318 kg)  SpO2 98%  LMP 06/15/2016 Wt Readings from Last 3 Encounters:  06/29/16 144 lb (65.318 kg)  10/25/15 141 lb 12.8 oz (64.32 kg)  03/12/15 143 lb (64.864 kg)    General: Appears her stated age, well developed, well nourished in NAD. Skin: Small pustular bump noted on right labia.  BMET    Component Value Date/Time   NA 136 03/09/2014 1545   K 4.5 03/09/2014 1545   CL 100 03/09/2014 1545   CO2 29 03/09/2014 1545   GLUCOSE 82 03/09/2014 1545   BUN 16 03/09/2014 1545   CREATININE 0.83 03/09/2014 1545   CALCIUM 9.6 03/09/2014 1545    Lipid Panel     Component Value Date/Time   CHOL 181 03/09/2014 1545   TRIG 72 03/09/2014 1545   HDL 68 03/09/2014 1545   CHOLHDL 2.7 03/09/2014 1545  VLDL 14 03/09/2014 1545   LDLCALC 99 03/09/2014 1545    CBC    Component Value Date/Time   WBC 6.8 03/09/2014 1545   RBC 4.63 03/09/2014 1545   HGB 14.2 03/09/2014 1545   HCT 40.2 03/09/2014 1545   PLT 295 03/09/2014 1545   MCV 86.8 03/09/2014 1545   MCH 30.7 03/09/2014 1545   MCHC 35.3 03/09/2014 1545   RDW 12.5 03/09/2014 1545    Hgb A1C No results found for: HGBA1C       Assessment & Plan:   Folliculitis of labia:  Likely from shaving/wxing Advised her to put warm compresses on the area Can apply Neosporin to the affected area 2 x day Return precautions given  RTC as needed or if symptoms persist or worsen Sharlisa Hollifield, NP

## 2016-10-22 ENCOUNTER — Encounter: Payer: Self-pay | Admitting: Internal Medicine

## 2016-10-22 ENCOUNTER — Other Ambulatory Visit (HOSPITAL_COMMUNITY)
Admission: RE | Admit: 2016-10-22 | Discharge: 2016-10-22 | Disposition: A | Discharge status: Home / Self Care | Payer: BC Managed Care – PPO | Source: Ambulatory Visit | Attending: Internal Medicine | Admitting: Internal Medicine

## 2016-10-22 ENCOUNTER — Ambulatory Visit (INDEPENDENT_AMBULATORY_CARE_PROVIDER_SITE_OTHER): Payer: BC Managed Care – PPO | Admitting: Internal Medicine

## 2016-10-22 VITALS — BP 102/60 | HR 58 | Temp 97.7°F | Ht 64.0 in | Wt 139.5 lb

## 2016-10-22 DIAGNOSIS — L989 Disorder of the skin and subcutaneous tissue, unspecified: Secondary | ICD-10-CM | POA: Diagnosis not present

## 2016-10-22 DIAGNOSIS — Z124 Encounter for screening for malignant neoplasm of cervix: Secondary | ICD-10-CM | POA: Diagnosis not present

## 2016-10-22 DIAGNOSIS — Z01419 Encounter for gynecological examination (general) (routine) without abnormal findings: Secondary | ICD-10-CM | POA: Insufficient documentation

## 2016-10-22 DIAGNOSIS — N760 Acute vaginitis: Secondary | ICD-10-CM | POA: Diagnosis not present

## 2016-10-22 DIAGNOSIS — B9689 Other specified bacterial agents as the cause of diseases classified elsewhere: Secondary | ICD-10-CM

## 2016-10-22 DIAGNOSIS — Z23 Encounter for immunization: Secondary | ICD-10-CM

## 2016-10-22 DIAGNOSIS — Z1151 Encounter for screening for human papillomavirus (HPV): Secondary | ICD-10-CM | POA: Insufficient documentation

## 2016-10-22 DIAGNOSIS — Z113 Encounter for screening for infections with a predominantly sexual mode of transmission: Secondary | ICD-10-CM | POA: Insufficient documentation

## 2016-10-22 DIAGNOSIS — Z0001 Encounter for general adult medical examination with abnormal findings: Secondary | ICD-10-CM | POA: Diagnosis not present

## 2016-10-22 LAB — CBC
HEMATOCRIT: 42.6 % (ref 36.0–46.0)
Hemoglobin: 14.4 g/dL (ref 12.0–15.0)
MCHC: 33.9 g/dL (ref 30.0–36.0)
MCV: 89.6 fl (ref 78.0–100.0)
PLATELETS: 270 10*3/uL (ref 150.0–400.0)
RBC: 4.76 Mil/uL (ref 3.87–5.11)
RDW: 12.4 % (ref 11.5–15.5)
WBC: 5.2 10*3/uL (ref 4.0–10.5)

## 2016-10-22 LAB — COMPREHENSIVE METABOLIC PANEL
ALBUMIN: 5 g/dL (ref 3.5–5.2)
ALK PHOS: 44 U/L (ref 39–117)
ALT: 14 U/L (ref 0–35)
AST: 17 U/L (ref 0–37)
BUN: 10 mg/dL (ref 6–23)
CALCIUM: 10.1 mg/dL (ref 8.4–10.5)
CHLORIDE: 104 meq/L (ref 96–112)
CO2: 30 mEq/L (ref 19–32)
CREATININE: 0.91 mg/dL (ref 0.40–1.20)
GFR: 73.13 mL/min (ref >60.00)
Glucose, Bld: 78 mg/dL (ref 70–99)
POTASSIUM: 4.2 meq/L (ref 3.5–5.1)
SODIUM: 141 meq/L (ref 135–145)
TOTAL PROTEIN: 7.9 g/dL (ref 6.0–8.3)
Total Bilirubin: 0.5 mg/dL (ref 0.2–1.2)

## 2016-10-22 LAB — LIPID PANEL
CHOLESTEROL: 246 mg/dL — AB (ref 0–200)
HDL: 84.2 mg/dL (ref >39.00)
LDL Cholesterol: 149 mg/dL — ABNORMAL HIGH (ref 0–99)
NonHDL: 161.33
TRIGLYCERIDES: 63 mg/dL (ref 0.0–149.0)
Total CHOL/HDL Ratio: 3
VLDL: 12.6 mg/dL (ref 0.0–40.0)

## 2016-10-22 MED ORDER — METRONIDAZOLE 500 MG PO TABS: 500.0000 mg | ORAL_TABLET | Freq: Two times a day (BID) | ORAL | 1 refills | Status: DC

## 2016-10-22 NOTE — Addendum Note (Signed)
Addended by: Lurlean Nanny on: 10/22/2016 09:25 AM   Modules accepted: Orders

## 2016-10-22 NOTE — Progress Notes (Signed)
Subjective:    Patient ID: Linda Fleming, female    DOB: 1976/04/06, 40 y.o.   MRN: [id number]  HPI  Pt presents to the clinic today for her annual exam.  She thinks she has another BV infection. She gets this often and is not sure why. She reports she recently had her IUD removed. She would like a refill of Flagyl today.  Flu: 09/2013 Tetanus: < 10 years ago Pap Smear: 05/2013 Dentist: biannually  Diet: She does eat meat. She consumes more veggies than fruits. She tries to avoid fried foods. She drinks mostly water and coffee. Exercise: She does crossfit, 5 days a week   Review of Systems      Past Medical History:  Diagnosis Date  . Cancer [facility]) 2009   Melanoma  . Chicken pox     No current outpatient prescriptions on file.   No current facility-administered medications for this visit.     No Known Allergies  Family History  Problem Relation Age of Onset  . Hyperlipidemia Mother   . Heart disease Maternal Grandfather   . Cancer Maternal Aunt     melanoma  . Diabetes Neg Hx   . Stroke Neg Hx     Social History   Social History  . Marital status: Married    Spouse name: N/A  . Number of children: N/A  . Years of education: N/A   Occupational History  . Not on file.   Social History Main Topics  . Smoking status: Never Smoker  . Smokeless tobacco: Not on file  . Alcohol use 0.6 oz/week    1 Glasses of wine per week     Comment: occasional  . Drug use: No  . Sexual activity: Yes   Other Topics Concern  . Not on file   Social History Narrative  . No narrative on file     Constitutional: Denies fever, malaise, fatigue, headache or abrupt weight changes.  HEENT: Pt reports bleeding gums. Denies eye pain, eye redness, ear pain, ringing in the ears, wax buildup, runny nose, nasal congestion, bloody nose, or sore throat. Respiratory: Denies difficulty breathing, shortness of breath, cough or sputum production.   Cardiovascular: Denies chest  pain, chest tightness, palpitations or swelling in the hands or feet.  Gastrointestinal: Denies abdominal pain, bloating, constipation, diarrhea or blood in the stool.  GU: Denies urgency, frequency, pain with urination, burning sensation, blood in urine, odor or discharge. Musculoskeletal: Denies decrease in range of motion, difficulty with gait, muscle pain or joint pain and swelling.  Skin: Pt reports a lump in front of her left ear. Denies redness, rashes, or ulcercations.  Neurological: Denies dizziness, difficulty with memory, difficulty with speech or problems with balance and coordination.  Psych: Denies anxiety, depression, SI/HI.  No other specific complaints in a complete review of systems (except as listed in HPI above).  Objective:   Physical Exam  BP 102/60   Pulse (!) 58   Temp 97.7 F (36.5 C) (Oral)   Ht 5\' 4"  (1.626 m)   Wt 139 lb 8 oz (63.3 kg)   LMP 10/19/2016   SpO2 97%   BMI 23.95 kg/m  Wt Readings from Last 3 Encounters:  10/22/16 139 lb 8 oz (63.3 kg)  06/29/16 144 lb (65.3 kg)  10/25/15 141 lb 12.8 oz (64.3 kg)    General: Appears her stated age, well developed, well nourished in NAD. Skin: Warm, dry and intact. Pinpoint sebaceous comedone noted in front of  the left ear. Lipoma noted of left buttock. HEENT: Head: normal shape and size; Eyes: sclera white, no icterus, conjunctiva pink, PERRLA and EOMs intact; Ears: Tm's gray and intact, normal light reflex; Throat/Mouth: Teeth present, mucosa pink and moist, no exudate, lesions or ulcerations noted.  Neck:  Neck supple, trachea midline. Anterior cervical lymph node noted on the right, small and mobile. No thyromegaly present.  Cardiovascular: Normal rate and rhythm. S1,S2 noted.  No murmur, rubs or gallops noted. No JVD or BLE edema. Pulmonary/Chest: Normal effort and positive vesicular breath sounds. No respiratory distress. No wheezes, rales or ronchi noted.  Abdomen: Soft and nontender. Normal bowel  sounds. No distention or masses noted. Liver, spleen and kidneys non palpable. Pelvic: Normal female anatomy. No cervical changes noted. No discharge noted. No CMT. Adnexa non palpable. Musculoskeletal: Normal range of motion. Strength 5/5 BUE/BLE. No difficulty with gait.  Neurological: Alert and oriented. Cranial nerves II-XII grossly intact. Coordination normal.  Psychiatric: Mood and affect normal. Behavior is normal. Judgment and thought content normal.    BMET    Component Value Date/Time   NA 136 03/09/2014 1545   K 4.5 03/09/2014 1545   CL 100 03/09/2014 1545   CO2 29 03/09/2014 1545   GLUCOSE 82 03/09/2014 1545   BUN 16 03/09/2014 1545   CREATININE 0.83 03/09/2014 1545   CALCIUM 9.6 03/09/2014 1545    Lipid Panel     Component Value Date/Time   CHOL 181 03/09/2014 1545   TRIG 72 03/09/2014 1545   HDL 68 03/09/2014 1545   CHOLHDL 2.7 03/09/2014 1545   VLDL 14 03/09/2014 1545   LDLCALC 99 03/09/2014 1545    CBC    Component Value Date/Time   WBC 6.8 03/09/2014 1545   RBC 4.63 03/09/2014 1545   HGB 14.2 03/09/2014 1545   HCT 40.2 03/09/2014 1545   PLT 295 03/09/2014 1545   MCV 86.8 03/09/2014 1545   MCH 30.7 03/09/2014 1545   MCHC 35.3 03/09/2014 1545   RDW 12.5 03/09/2014 1545    Hgb A1C No results found for: HGBA1C       Assessment & Plan:   Preventative Health Maintenance:  Flu shot today She insist her tetanus is UTD Will get baseline mammogram next year Pap smear today- STD screen added Encouraged her to consume a balanced diet and exercise regimen Advised her to see a dentist annually Will check CBC, CMET, Lipid Profile today She declines HIV screening  Skin lesion:  Will monitor for now If gets bigger in size or changes in appearance, let me know  Recurrent BV:  Refilled Flagyl prescription today  RTC in 1 year, sooner if needed Webb Silversmith, NP

## 2016-10-22 NOTE — Patient Instructions (Signed)
Health Maintenance, Female Adopting a healthy lifestyle and getting preventive care can go a long way to promote health and wellness. Talk with your health care provider about what schedule of regular examinations is right for you. This is a good chance for you to check in with your provider about disease prevention and staying healthy. In between checkups, there are plenty of things you can do on your own. Experts have done a lot of research about which lifestyle changes and preventive measures are most likely to keep you healthy. Ask your health care provider for more information. WEIGHT AND DIET  Eat a healthy diet  Be sure to include plenty of vegetables, fruits, low-fat dairy products, and lean protein.  Do not eat a lot of foods high in solid fats, added sugars, or salt.  Get regular exercise. This is one of the most important things you can do for your health.  Most adults should exercise for at least 150 minutes each week. The exercise should increase your heart rate and make you sweat (moderate-intensity exercise).  Most adults should also do strengthening exercises at least twice a week. This is in addition to the moderate-intensity exercise.  Maintain a healthy weight  Body mass index (BMI) is a measurement that can be used to identify possible weight problems. It estimates body fat based on height and weight. Your health care provider can help determine your BMI and help you achieve or maintain a healthy weight.  For females 20 years of age and older:   A BMI below 18.5 is considered underweight.  A BMI of 18.5 to 24.9 is normal.  A BMI of 25 to 29.9 is considered overweight.  A BMI of 30 and above is considered obese.  Watch levels of cholesterol and blood lipids  You should start having your blood tested for lipids and cholesterol at 39 years of age, then have this test every 5 years.  You may need to have your cholesterol levels checked more often if:  Your lipid  or cholesterol levels are high.  You are older than 39 years of age.  You are at high risk for heart disease.  CANCER SCREENING   Lung Cancer  Lung cancer screening is recommended for adults 55-80 years old who are at high risk for lung cancer because of a history of smoking.  A yearly low-dose CT scan of the lungs is recommended for people who:  Currently smoke.  Have quit within the past 15 years.  Have at least a 30-pack-year history of smoking. A pack year is smoking an average of one pack of cigarettes a day for 1 year.  Yearly screening should continue until it has been 15 years since you quit.  Yearly screening should stop if you develop a health problem that would prevent you from having lung cancer treatment.  Breast Cancer  Practice breast self-awareness. This means understanding how your breasts normally appear and feel.  It also means doing regular breast self-exams. Let your health care provider know about any changes, no matter how small.  If you are in your 20s or 30s, you should have a clinical breast exam (CBE) by a health care provider every 1-3 years as part of a regular health exam.  If you are 40 or older, have a CBE every year. Also consider having a breast X-ray (mammogram) every year.  If you have a family history of breast cancer, talk to your health care provider about genetic screening.  If you   are at high risk for breast cancer, talk to your health care provider about having an MRI and a mammogram every year.  Breast cancer gene (BRCA) assessment is recommended for women who have family members with BRCA-related cancers. BRCA-related cancers include:  Breast.  Ovarian.  Tubal.  Peritoneal cancers.  Results of the assessment will determine the need for genetic counseling and BRCA1 and BRCA2 testing. Cervical Cancer Your health care provider may recommend that you be screened regularly for cancer of the pelvic organs (ovaries, uterus, and  vagina). This screening involves a pelvic examination, including checking for microscopic changes to the surface of your cervix (Pap test). You may be encouraged to have this screening done every 3 years, beginning at age 21.  For women ages 30-65, health care providers may recommend pelvic exams and Pap testing every 3 years, or they may recommend the Pap and pelvic exam, combined with testing for human papilloma virus (HPV), every 5 years. Some types of HPV increase your risk of cervical cancer. Testing for HPV may also be done on women of any age with unclear Pap test results.  Other health care providers may not recommend any screening for nonpregnant women who are considered low risk for pelvic cancer and who do not have symptoms. Ask your health care provider if a screening pelvic exam is right for you.  If you have had past treatment for cervical cancer or a condition that could lead to cancer, you need Pap tests and screening for cancer for at least 20 years after your treatment. If Pap tests have been discontinued, your risk factors (such as having a new sexual partner) need to be reassessed to determine if screening should resume. Some women have medical problems that increase the chance of getting cervical cancer. In these cases, your health care provider may recommend more frequent screening and Pap tests. Colorectal Cancer  This type of cancer can be detected and often prevented.  Routine colorectal cancer screening usually begins at 39 years of age and continues through 39 years of age.  Your health care provider may recommend screening at an earlier age if you have risk factors for colon cancer.  Your health care provider may also recommend using home test kits to check for hidden blood in the stool.  A small camera at the end of a tube can be used to examine your colon directly (sigmoidoscopy or colonoscopy). This is done to check for the earliest forms of colorectal  cancer.  Routine screening usually begins at age 50.  Direct examination of the colon should be repeated every 5-10 years through 39 years of age. However, you may need to be screened more often if early forms of precancerous polyps or small growths are found. Skin Cancer  Check your skin from head to toe regularly.  Tell your health care provider about any new moles or changes in moles, especially if there is a change in a mole's shape or color.  Also tell your health care provider if you have a mole that is larger than the size of a pencil eraser.  Always use sunscreen. Apply sunscreen liberally and repeatedly throughout the day.  Protect yourself by wearing long sleeves, pants, a wide-brimmed hat, and sunglasses whenever you are outside. HEART DISEASE, DIABETES, AND HIGH BLOOD PRESSURE   High blood pressure causes heart disease and increases the risk of stroke. High blood pressure is more likely to develop in:  People who have blood pressure in the high end   of the normal range (130-139/85-89 mm Hg).  People who are overweight or obese.  People who are African American.  If you are 38-23 years of age, have your blood pressure checked every 3-5 years. If you are 61 years of age or older, have your blood pressure checked every year. You should have your blood pressure measured twice--once when you are at a hospital or clinic, and once when you are not at a hospital or clinic. Record the average of the two measurements. To check your blood pressure when you are not at a hospital or clinic, you can use:  An automated blood pressure machine at a pharmacy.  A home blood pressure monitor.  If you are between 45 years and 39 years old, ask your health care provider if you should take aspirin to prevent strokes.  Have regular diabetes screenings. This involves taking a blood sample to check your fasting blood sugar level.  If you are at a normal weight and have a low risk for diabetes,  have this test once every three years after 39 years of age.  If you are overweight and have a high risk for diabetes, consider being tested at a younger age or more often. PREVENTING INFECTION  Hepatitis B  If you have a higher risk for hepatitis B, you should be screened for this virus. You are considered at high risk for hepatitis B if:  You were born in a country where hepatitis B is common. Ask your health care provider which countries are considered high risk.  Your parents were born in a high-risk country, and you have not been immunized against hepatitis B (hepatitis B vaccine).  You have HIV or AIDS.  You use needles to inject street drugs.  You live with someone who has hepatitis B.  You have had sex with someone who has hepatitis B.  You get hemodialysis treatment.  You take certain medicines for conditions, including cancer, organ transplantation, and autoimmune conditions. Hepatitis C  Blood testing is recommended for:  Everyone born from [birth year] through 1964.  Anyone with known risk factors for hepatitis C. Sexually transmitted infections (STIs)  You should be screened for sexually transmitted infections (STIs) including gonorrhea and chlamydia if:  You are sexually active and are younger than 39 years of age.  You are older than 39 years of age and your health care provider tells you that you are at risk for this type of infection.  Your sexual activity has changed since you were last screened and you are at an increased risk for chlamydia or gonorrhea. Ask your health care provider if you are at risk.  If you do not have HIV, but are at risk, it may be recommended that you take a prescription medicine daily to prevent HIV infection. This is called pre-exposure prophylaxis (PrEP). You are considered at risk if:  You are sexually active and do not regularly use condoms or know the HIV status of your partner(s).  You take drugs by injection.  You are sexually  active with a partner who has HIV. Talk with your health care provider about whether you are at high risk of being infected with HIV. If you choose to begin PrEP, you should first be tested for HIV. You should then be tested every 3 months for as long as you are taking PrEP.  PREGNANCY   If you are premenopausal and you may become pregnant, ask your health care provider about preconception counseling.  If you may  become pregnant, take 400 to 800 micrograms (mcg) of folic acid every day.  If you want to prevent pregnancy, talk to your health care provider about birth control (contraception). OSTEOPOROSIS AND MENOPAUSE   Osteoporosis is a disease in which the bones lose minerals and strength with aging. This can result in serious bone fractures. Your risk for osteoporosis can be identified using a bone density scan.  If you are 61 years of age or older, or if you are at risk for osteoporosis and fractures, ask your health care provider if you should be screened.  Ask your health care provider whether you should take a calcium or vitamin D supplement to lower your risk for osteoporosis.  Menopause may have certain physical symptoms and risks.  Hormone replacement therapy may reduce some of these symptoms and risks. Talk to your health care provider about whether hormone replacement therapy is right for you.  HOME CARE INSTRUCTIONS   Schedule regular health, dental, and eye exams.  Stay current with your immunizations.   Do not use any tobacco products including cigarettes, chewing tobacco, or electronic cigarettes.  If you are pregnant, do not drink alcohol.  If you are breastfeeding, limit how much and how often you drink alcohol.  Limit alcohol intake to no more than 1 drink per day for nonpregnant women. One drink equals 12 ounces of beer, 5 ounces of wine, or 1 ounces of hard liquor.  Do not use street drugs.  Do not share needles.  Ask your health care provider for help if  you need support or information about quitting drugs.  Tell your health care provider if you often feel depressed.  Tell your health care provider if you have ever been abused or do not feel safe at home.   This information is not intended to replace advice given to you by your health care provider. Make sure you discuss any questions you have with your health care provider.   Document Released: 06/22/2011 Document Revised: 12/28/2014 Document Reviewed: 11/08/2013 Elsevier Interactive Patient Education [employer].

## 2016-10-26 ENCOUNTER — Encounter: Payer: BC Managed Care – PPO | Admitting: Internal Medicine

## 2016-10-26 LAB — CERVICOVAGINAL ANCILLARY ONLY
BACTERIAL VAGINITIS: NEGATIVE
CANDIDA VAGINITIS: NEGATIVE

## 2016-10-27 LAB — CYTOLOGY - PAP
CHLAMYDIA, DNA PROBE: NEGATIVE
DIAGNOSIS: NEGATIVE
HPV: NOT DETECTED
Neisseria Gonorrhea: NEGATIVE
Trichomonas: NEGATIVE

## 2016-11-11 ENCOUNTER — Encounter: Payer: Self-pay | Admitting: Internal Medicine

## 2016-11-11 ENCOUNTER — Ambulatory Visit (INDEPENDENT_AMBULATORY_CARE_PROVIDER_SITE_OTHER): Payer: BC Managed Care – PPO | Admitting: Internal Medicine

## 2016-11-11 VITALS — BP 106/70 | HR 54 | Temp 97.8°F | Wt 141.0 lb

## 2016-11-11 DIAGNOSIS — N898 Other specified noninflammatory disorders of vagina: Secondary | ICD-10-CM | POA: Diagnosis not present

## 2016-11-11 DIAGNOSIS — B9689 Other specified bacterial agents as the cause of diseases classified elsewhere: Secondary | ICD-10-CM

## 2016-11-11 DIAGNOSIS — N760 Acute vaginitis: Secondary | ICD-10-CM | POA: Diagnosis not present

## 2016-11-11 NOTE — Addendum Note (Signed)
Addended by: Lurlean Nanny on: 11/11/2016 02:01 PM   Modules accepted: Orders

## 2016-11-11 NOTE — Patient Instructions (Signed)

## 2016-11-11 NOTE — Progress Notes (Signed)
Subjective:    Patient ID: Linda Fleming, female    DOB: May 24, 1976, 40 y.o.   MRN: [id number]  HPI  Pt presents to the clinic today with c/o vaginal odor. This started 1 week ago. She denies vaginal discharge, itching or burning. She reports that she frequently gets BV. She took a course of Flagyl that she had left over last week, and reports her symptoms have improved. She wants to be tested to make sure the [facility] is gone. She also reports that her GYN previously prescribed Boric Acid capsules, which worked really well for her in the past. She is wanting to get a prescription for that today.  Review of Systems  Past Medical History:  Diagnosis Date  . Cancer [facility]) 2009   Melanoma  . Chicken pox     Current Outpatient Prescriptions  Medication Sig Dispense Refill  . metroNIDAZOLE (FLAGYL) 500 MG tablet Take 1 tablet (500 mg total) by mouth 2 (two) times daily. (Patient not taking: Reported on 11/11/2016) 10 tablet 1   No current facility-administered medications for this visit.     No Known Allergies  Family History  Problem Relation Age of Onset  . Hyperlipidemia Mother   . Heart disease Maternal Grandfather   . Cancer Maternal Aunt     melanoma  . Diabetes Neg Hx   . Stroke Neg Hx     Social History   Social History  . Marital status: Married    Spouse name: N/A  . Number of children: N/A  . Years of education: N/A   Occupational History  . Not on file.   Social History Main Topics  . Smoking status: Never Smoker  . Smokeless tobacco: Never Used  . Alcohol use 0.6 oz/week    1 Glasses of wine per week     Comment: occasional  . Drug use: No  . Sexual activity: Yes   Other Topics Concern  . Not on file   Social History Narrative  . No narrative on file     Constitutional: Denies fever, malaise, fatigue, headache or abrupt weight changes.  Gastrointestinal: Denies abdominal pain, bloating, constipation, diarrhea or blood in the stool.  GU: Pt  reports vaginal odor. Denies urgency, frequency, pain with urination, burning sensation, blood in urine, or discharge.  No other specific complaints in a complete review of systems (except as listed in HPI above).     Objective:   Physical Exam   BP 106/70   Pulse (!) 54   Temp 97.8 F (36.6 C) (Oral)   Wt 141 lb (64 kg)   LMP 10/19/2016   SpO2 98%   BMI 24.20 kg/m  Wt Readings from Last 3 Encounters:  11/11/16 141 lb (64 kg)  10/22/16 139 lb 8 oz (63.3 kg)  06/29/16 144 lb (65.3 kg)    General: Appears her stated age, well developed, well nourished in NAD. Abdomen: Soft and nontender. Normal bowel sounds. No distention or masses noted.  Vaginal: Deferred. Pt self swabbed for wet prep.  BMET    Component Value Date/Time   NA 141 10/22/2016 0914   K 4.2 10/22/2016 0914   CL 104 10/22/2016 0914   CO2 30 10/22/2016 0914   GLUCOSE 78 10/22/2016 0914   BUN 10 10/22/2016 0914   CREATININE 0.91 10/22/2016 0914   CREATININE 0.83 03/09/2014 1545   CALCIUM 10.1 10/22/2016 0914    Lipid Panel     Component Value Date/Time   CHOL 246 (H) 10/22/2016  0914   TRIG 63.0 10/22/2016 0914   HDL 84.20 10/22/2016 0914   CHOLHDL 3 10/22/2016 0914   VLDL 12.6 10/22/2016 0914   LDLCALC 149 (H) 10/22/2016 0914    CBC    Component Value Date/Time   WBC 5.2 10/22/2016 0914   RBC 4.76 10/22/2016 0914   HGB 14.4 10/22/2016 0914   HCT 42.6 10/22/2016 0914   PLT 270.0 10/22/2016 0914   MCV 89.6 10/22/2016 0914   MCH 30.7 03/09/2014 1545   MCHC 33.9 10/22/2016 0914   RDW 12.4 10/22/2016 0914    Hgb A1C No results found for: HGBA1C       Assessment & Plan:   Vaginal odor secondary to recurrent BV:  Wet prep sent to lab RX for Boric Acid called into [facility] Consider starting a probiotic daily  RTC as needed or if symptoms persist or worsen Kimari Lienhard, NP

## 2016-11-12 LAB — WET PREP BY MOLECULAR PROBE
CANDIDA SPECIES: NEGATIVE
Gardnerella vaginalis: POSITIVE — AB
Trichomonas vaginosis: NEGATIVE

## 2016-11-16 ENCOUNTER — Other Ambulatory Visit: Payer: Self-pay | Admitting: Internal Medicine

## 2016-11-16 MED ORDER — METRONIDAZOLE 500 MG PO TABS: 500.0000 mg | ORAL_TABLET | Freq: Two times a day (BID) | ORAL | 1 refills | Status: DC

## 2016-11-18 ENCOUNTER — Other Ambulatory Visit: Payer: Self-pay | Admitting: Internal Medicine

## 2017-01-08 ENCOUNTER — Ambulatory Visit (INDEPENDENT_AMBULATORY_CARE_PROVIDER_SITE_OTHER): Payer: BC Managed Care – PPO | Admitting: Family Medicine

## 2017-01-08 ENCOUNTER — Encounter: Payer: Self-pay | Admitting: Family Medicine

## 2017-01-08 DIAGNOSIS — J069 Acute upper respiratory infection, unspecified: Secondary | ICD-10-CM | POA: Diagnosis not present

## 2017-01-08 DIAGNOSIS — B9789 Other viral agents as the cause of diseases classified elsewhere: Secondary | ICD-10-CM | POA: Diagnosis not present

## 2017-01-08 MED ORDER — GUAIFENESIN-CODEINE 100-10 MG/5ML PO SYRP: 5.0000 mL | ORAL_SOLUTION | Freq: Every evening | ORAL | 0 refills | Status: DC | PRN

## 2017-01-08 NOTE — Patient Instructions (Signed)
Most likely viral upper respiratory illness. Start cough suppressant at night.  Rest, fluids.  During the day nasal saline and nasal flonase ( 2 sprays per nostril).  Can add mucinex DM during the day.  Expect 7-10 days of illness.

## 2017-01-08 NOTE — Progress Notes (Signed)
Pre visit review using our clinic review tool, if applicable. No additional management support is needed unless otherwise documented below in the visit note. 

## 2017-01-08 NOTE — Assessment & Plan Note (Signed)
As well as eustachian tube dysfunction.  Pt healthy and low risk, not clearly flu and past 72 hours for use of tamiflu. Will not check flu test for these reasons.  Treat symptomatically.

## 2017-01-08 NOTE — Progress Notes (Signed)
   Subjective:    Patient ID: Linda Fleming, female    DOB: 01-12-1976, 41 y.o.   MRN: [id number]  Cough  This is a new problem. The current episode started in the past 7 days ( 3 days). The problem has been unchanged. The cough is non-productive. Associated symptoms include chills, ear pain, nasal congestion, a rash and a sore throat. Pertinent negatives include no fever, myalgias, shortness of breath or wheezing. Associated symptoms comments:  Fatigue Bilateral ear fullness and pain. The symptoms are aggravated by lying down (cough keeping her up at night). Risk factors: nonsmoker. Treatments tried: mucinex, advil. The treatment provided mild relief. There is no history of asthma, environmental allergies or pneumonia.  no recent antibitoics in last month  Sore Throat   Associated symptoms include coughing and ear pain. Pertinent negatives include no shortness of breath.   Sick contact: works in [employer].   Review of Systems  Constitutional: Positive for chills. Negative for fever.  HENT: Positive for ear pain and sore throat.   Respiratory: Positive for cough. Negative for shortness of breath and wheezing.   Musculoskeletal: Negative for myalgias.  Skin: Positive for rash.  Allergic/Immunologic: Negative for environmental allergies.       Objective:   Physical Exam  Constitutional: Vital signs are normal. She appears well-developed and well-nourished. She is cooperative.  Non-toxic appearance. She does not appear ill. No distress.  HENT:  Head: Normocephalic.  Right Ear: Hearing, external ear and ear canal normal. Tympanic membrane is not erythematous, not retracted and not bulging. A middle ear effusion is present.  Left Ear: Hearing, external ear and ear canal normal. Tympanic membrane is not erythematous, not retracted and not bulging. A middle ear effusion is present.  Nose: Mucosal edema and rhinorrhea present. Right sinus exhibits no maxillary sinus tenderness and no frontal  sinus tenderness. Left sinus exhibits no maxillary sinus tenderness and no frontal sinus tenderness.  Mouth/Throat: Uvula is midline and mucous membranes are normal. Posterior oropharyngeal erythema present. No oropharyngeal exudate, posterior oropharyngeal edema or tonsillar abscesses.  Eyes: Conjunctivae, EOM and lids are normal. Pupils are equal, round, and reactive to light. Lids are everted and swept, no foreign bodies found.  Neck: Trachea normal and normal range of motion. Neck supple. Carotid bruit is not present. No thyroid mass and no thyromegaly present.  Cardiovascular: Normal rate, regular rhythm, S1 normal, S2 normal, normal heart sounds, intact distal pulses and normal pulses.  Exam reveals no gallop and no friction rub.   No murmur heard. Pulmonary/Chest: Effort normal and breath sounds normal. No tachypnea. No respiratory distress. She has no decreased breath sounds. She has no wheezes. She has no rhonchi. She has no rales.  Neurological: She is alert.  Skin: Skin is warm, dry and intact. No rash noted.  Psychiatric: Her speech is normal and behavior is normal. Judgment normal. Her mood appears not anxious. Cognition and memory are normal. She does not exhibit a depressed mood.          Assessment & Plan:

## 2017-01-14 ENCOUNTER — Encounter: Payer: Self-pay | Admitting: Primary Care

## 2017-01-14 ENCOUNTER — Ambulatory Visit (INDEPENDENT_AMBULATORY_CARE_PROVIDER_SITE_OTHER): Payer: BC Managed Care – PPO | Admitting: Primary Care

## 2017-01-14 VITALS — BP 110/74 | HR 58 | Temp 97.6°F | Ht 64.0 in | Wt 144.0 lb

## 2017-01-14 DIAGNOSIS — J069 Acute upper respiratory infection, unspecified: Secondary | ICD-10-CM

## 2017-01-14 DIAGNOSIS — B9789 Other viral agents as the cause of diseases classified elsewhere: Secondary | ICD-10-CM | POA: Diagnosis not present

## 2017-01-14 DIAGNOSIS — R590 Localized enlarged lymph nodes: Secondary | ICD-10-CM | POA: Diagnosis not present

## 2017-01-14 MED ORDER — METHYLPREDNISOLONE ACETATE 80 MG/ML IJ SUSP
80.0000 mg | Freq: Once | INTRAMUSCULAR | Status: AC
  Administered 2017-01-14: 80 mg via INTRAMUSCULAR

## 2017-01-14 NOTE — Progress Notes (Signed)
Pre visit review using our clinic review tool, if applicable. No additional management support is needed unless otherwise documented below in the visit note. 

## 2017-01-14 NOTE — Patient Instructions (Signed)
Your symptoms are representative of a viral illness which will resolve on its own over time. Our goal is to treat your symptoms in order to aid your body in the healing process and to make you more comfortable.   You were provided with an injection of steroids today to help reduce inflammation and swelling. Refrain from taking Ibuprofen today.  Ensure you are staying hydrated and rest.  It was a pleasure meeting you!

## 2017-01-14 NOTE — Progress Notes (Signed)
Subjective:    Patient ID: Linda Fleming, female    DOB: 03-Aug-1976, 41 y.o.   MRN: [id number]  HPI  Ms. Linda Fleming is a 41 year old female who presents today with a chief complaint of sore throat. She was evaluated on 01/08/17 with a three day history of sore throat and cough with bilateral ear fullness. She was determined to have viral URI with eustachian tube dysfunction and was treated with conservative measures.  Since her last visit she's noticed swollen glands for the last 2-3 days, pain to her mouth. Her sore throat and cough has improved, but is worried due to swollen glands and fatigue. She denies fevers. Her cough is productive with yellow sputum. She's taken ibuprofen with some improvement.   Review of Systems  Constitutional: Positive for fatigue. Negative for chills and fever.  HENT: Positive for sore throat. Negative for congestion and sinus pressure.   Respiratory: Positive for cough. Negative for shortness of breath.   Cardiovascular: Negative for chest pain.  Hematological: Positive for adenopathy.       Past Medical History:  Diagnosis Date  . Cancer [facility]) 2009   Melanoma  . Chicken pox      Social History   Social History  . Marital status: Married    Spouse name: N/A  . Number of children: N/A  . Years of education: N/A   Occupational History  . Not on file.   Social History Main Topics  . Smoking status: Never Smoker  . Smokeless tobacco: Never Used  . Alcohol use 0.6 oz/week    1 Glasses of wine per week     Comment: occasional  . Drug use: No  . Sexual activity: Yes   Other Topics Concern  . Not on file   Social History Narrative  . No narrative on file    Past Surgical History:  Procedure Laterality Date  . LYMPH GLAND EXCISION  2009  . MELANOMA EXCISION  2009    Family History  Problem Relation Age of Onset  . Hyperlipidemia Mother   . Heart disease Maternal Grandfather   . Cancer Maternal Aunt     melanoma  . Diabetes Neg Hx    . Stroke Neg Hx     No Known Allergies  Current Outpatient Prescriptions on File Prior to Visit  Medication Sig Dispense Refill  . Boric Acid CRYS INSERT VAGINALLY TWICE A WEEK AS DIRECTED. 14400 g 0  . metroNIDAZOLE (FLAGYL) 500 MG tablet Take 1 tablet (500 mg total) by mouth 2 (two) times daily. (Patient not taking: Reported on 01/14/2017) 10 tablet 1   No current facility-administered medications on file prior to visit.     BP 110/74   Pulse (!) 58   Temp 97.6 F (36.4 C) (Oral)   Ht 5\' 4"  (1.626 m)   Wt 144 lb (65.3 kg)   LMP 01/07/2017   SpO2 99%   BMI 24.72 kg/m    Objective:   Physical Exam  Constitutional: She appears well-nourished. She does not appear ill.  HENT:  Right Ear: Tympanic membrane and ear canal normal.  Left Ear: Tympanic membrane and ear canal normal.  Nose: Right sinus exhibits no maxillary sinus tenderness and no frontal sinus tenderness. Left sinus exhibits no maxillary sinus tenderness and no frontal sinus tenderness.  Mouth/Throat: Oropharynx is clear and moist.  Eyes: Conjunctivae are normal.  Neck: Neck supple.  Cardiovascular: Normal rate and regular rhythm.   Pulmonary/Chest: Effort normal and breath sounds  normal. She has no wheezes. She has no rales.  Lymphadenopathy:    She has cervical adenopathy.  Skin: Skin is warm and dry.          Assessment & Plan:  Cervical Adenopathy:  Present for the past 2-3 days. Also recovering from viral URI. Exam today with clear lungs, stable vitals, does not appear acutely ill. Cervical lymphadenopathy to bilateral cervical chains.  Suspect reactive lymphadenopathy to viral URI. No evidence for bacterial involvement.  Will provide IM Depo Medrol for swelling, discussed with PCP. Fluids, rest, follow up PRN. Reassurance provided.  Sheral Flow, NP

## 2017-01-14 NOTE — Addendum Note (Signed)
Addended by: Jacqualin Combes on: 01/14/2017 10:42 AM   Modules accepted: Orders

## 2017-09-15 ENCOUNTER — Other Ambulatory Visit: Payer: Self-pay | Admitting: Internal Medicine

## 2017-12-07 ENCOUNTER — Telehealth: Payer: Self-pay | Admitting: Internal Medicine

## 2017-12-07 MED ORDER — ACYCLOVIR 400 MG PO TABS: 400.0000 mg | ORAL_TABLET | Freq: Two times a day (BID) | ORAL | 1 refills | Status: DC

## 2017-12-07 NOTE — Addendum Note (Signed)
Addended by: Jearld Fenton on: 12/07/2017 03:01 PM   Modules accepted: Orders

## 2017-12-07 NOTE — Telephone Encounter (Signed)
Copied from [place] [id number]. Topic: Quick Communication - See Telephone Encounter >> Dec 07, 2017 12:02 PM Bea Graff, NT wrote: CRM for notification. See Telephone encounter for: Pt will like to see if Webb Silversmith can fill her Acyclovir? Uses [facility] [employer] on [address].  12/07/17.

## 2017-12-07 NOTE — Telephone Encounter (Signed)
Yes, I will be able to fill this.

## 2018-01-12 ENCOUNTER — Other Ambulatory Visit: Payer: Self-pay

## 2018-01-12 MED ORDER — ACYCLOVIR 400 MG PO TABS: 400.0000 mg | ORAL_TABLET | Freq: Two times a day (BID) | ORAL | 0 refills | Status: DC

## 2018-04-15 ENCOUNTER — Other Ambulatory Visit: Payer: Self-pay | Admitting: Internal Medicine

## 2018-04-26 ENCOUNTER — Ambulatory Visit (INDEPENDENT_AMBULATORY_CARE_PROVIDER_SITE_OTHER): Payer: BC Managed Care – PPO | Admitting: Internal Medicine

## 2018-04-26 ENCOUNTER — Encounter: Payer: Self-pay | Admitting: Internal Medicine

## 2018-04-26 ENCOUNTER — Telehealth: Payer: Self-pay | Admitting: Internal Medicine

## 2018-04-26 VITALS — BP 102/68 | HR 54 | Temp 98.2°F | Ht 64.0 in | Wt 145.0 lb

## 2018-04-26 DIAGNOSIS — B009 Herpesviral infection, unspecified: Secondary | ICD-10-CM | POA: Diagnosis not present

## 2018-04-26 DIAGNOSIS — Z124 Encounter for screening for malignant neoplasm of cervix: Secondary | ICD-10-CM | POA: Diagnosis not present

## 2018-04-26 DIAGNOSIS — Z1231 Encounter for screening mammogram for malignant neoplasm of breast: Secondary | ICD-10-CM | POA: Diagnosis not present

## 2018-04-26 DIAGNOSIS — Z0000 Encounter for general adult medical examination without abnormal findings: Secondary | ICD-10-CM

## 2018-04-26 DIAGNOSIS — Z23 Encounter for immunization: Secondary | ICD-10-CM

## 2018-04-26 DIAGNOSIS — Z1239 Encounter for other screening for malignant neoplasm of breast: Secondary | ICD-10-CM

## 2018-04-26 LAB — COMPREHENSIVE METABOLIC PANEL
ALBUMIN: 4.6 g/dL (ref 3.5–5.2)
ALT: 14 U/L (ref 0–35)
AST: 17 U/L (ref 0–37)
Alkaline Phosphatase: 40 U/L (ref 39–117)
BUN: 15 mg/dL (ref 6–23)
CALCIUM: 9.6 mg/dL (ref 8.4–10.5)
CHLORIDE: 102 meq/L (ref 96–112)
CO2: 31 mEq/L (ref 19–32)
Creatinine, Ser: 0.9 mg/dL (ref 0.40–1.20)
GFR: 73.5 mL/min (ref >60.00)
Glucose, Bld: 94 mg/dL (ref 70–99)
POTASSIUM: 4.6 meq/L (ref 3.5–5.1)
Sodium: 138 mEq/L (ref 135–145)
Total Bilirubin: 0.5 mg/dL (ref 0.2–1.2)
Total Protein: 7.4 g/dL (ref 6.0–8.3)

## 2018-04-26 LAB — LIPID PANEL
CHOLESTEROL: 230 mg/dL — AB (ref 0–200)
HDL: 78.3 mg/dL (ref >39.00)
LDL Cholesterol: 137 mg/dL — ABNORMAL HIGH (ref 0–99)
NonHDL: 151.93
Total CHOL/HDL Ratio: 3
Triglycerides: 73 mg/dL (ref 0.0–149.0)
VLDL: 14.6 mg/dL (ref 0.0–40.0)

## 2018-04-26 LAB — CBC
HEMATOCRIT: 40.6 % (ref 36.0–46.0)
Hemoglobin: 13.9 g/dL (ref 12.0–15.0)
MCHC: 34.2 g/dL (ref 30.0–36.0)
MCV: 89.4 fl (ref 78.0–100.0)
PLATELETS: 250 10*3/uL (ref 150.0–400.0)
RBC: 4.54 Mil/uL (ref 3.87–5.11)
RDW: 12.8 % (ref 11.5–15.5)
WBC: 5.1 10*3/uL (ref 4.0–10.5)

## 2018-04-26 LAB — VITAMIN D 25 HYDROXY (VIT D DEFICIENCY, FRACTURES): VITD: 29 ng/mL — ABNORMAL LOW (ref 30.00–100.00)

## 2018-04-26 NOTE — Telephone Encounter (Signed)
Patient is moving closer to [place].  Patient would like to switch from [facility] to [facility].  Can patient transfer?

## 2018-04-26 NOTE — Patient Instructions (Signed)

## 2018-04-26 NOTE — Progress Notes (Signed)
Subjective:    Patient ID: Linda Fleming, female    DOB: [birth year], 42 y.o.   MRN: [id number]  HPI  Pt presents to the clinic today for her annual exam.  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 She takes Acyclovir as needed.  Flu: 09/2017 Tetanus: > 10 years ago Pap Smear: 10/2016 Mammogram: never Vision Screening: as needed Dentist: biannually  Diet: She does eat meat. She consumes some fruits and veggies daily. She occassionally eats fried foods. She drinks mostly water and coughing. Exercise: Running and cross fit  Review of Systems      Past Medical History:  Diagnosis Date  . Cancer [facility]) 2009   Melanoma  . Chicken pox     Current Outpatient Medications  Medication Sig Dispense Refill  . acyclovir (ZOVIRAX) 400 MG tablet TAKE 1 TABLET (400 MG TOTAL) BY MOUTH 2 (TWO) TIMES DAILY. MUST SCHEDULE ANNUAL EXAM 60 tablet 0  . Boric Acid CRYS INSERT VAGINALLY TWICE A WEEK AS DIRECTED. 14400 g 0   No current facility-administered medications for this visit.     No Known Allergies  Family History  Problem Relation Age of Onset  . Hyperlipidemia Mother   . Heart disease Maternal Grandfather   . Cancer Maternal Aunt        melanoma  . Diabetes Neg Hx   . Stroke Neg Hx     Social History   Socioeconomic History  . Marital status: Married    Spouse name: Not on file  . Number of children: Not on file  . Years of education: Not on file  . Highest education level: Not on file  Occupational History  . Not on file  Social Needs  . Financial resource strain: Not on file  . Food insecurity:    Worry: Not on file    Inability: Not on file  . Transportation needs:    Medical: Not on file    Non-medical: Not on file  Tobacco Use  . Smoking status: Never Smoker  . Smokeless tobacco: Never Used  Substance and Sexual Activity  . Alcohol use: Yes    Alcohol/week: 0.6 oz    Types: 1 Glasses of wine per week    Comment: occasional  . Drug use: No   . Sexual activity: Yes  Lifestyle  . Physical activity:    Days per week: Not on file    Minutes per session: Not on file  . Stress: Not on file  Relationships  . Social connections:    Talks on phone: Not on file    Gets together: Not on file    Attends religious service: Not on file    Active member of club or organization: Not on file    Attends meetings of clubs or organizations: Not on file    Relationship status: Not on file  . Intimate partner violence:    Fear of current or ex partner: Not on file    Emotionally abused: Not on file    Physically abused: Not on file    Forced sexual activity: Not on file  Other Topics Concern  . Not on file  Social History Narrative  . Not on file     Constitutional: Denies fever, malaise, fatigue, headache or abrupt weight changes.  HEENT: Denies eye pain, eye redness, ear pain, ringing in the ears, wax buildup, runny nose, nasal congestion, bloody nose, or sore throat. Respiratory: Denies difficulty breathing, shortness of breath, cough  or sputum production.   Cardiovascular: Denies chest pain, chest tightness, palpitations or swelling in the hands or feet.  Gastrointestinal: Denies abdominal pain, bloating, constipation, diarrhea or blood in the stool.  GU: Denies urgency, frequency, pain with urination, burning sensation, blood in urine, odor or discharge. Musculoskeletal: Denies decrease in range of motion, difficulty with gait, muscle pain or joint pain and swelling.  Skin: Denies redness, rashes, lesions or ulcercations.  Neurological: Denies dizziness, difficulty with memory, difficulty with speech or problems with balance and coordination.  Psych: Denies anxiety, depression, SI/HI.  No other specific complaints in a complete review of systems (except as listed in HPI above).  Objective:   Physical Exam   BP 102/68   Pulse (!) 54   Temp 98.2 F (36.8 C) (Oral)   Ht 5\' 4"  (1.626 m)   Wt 145 lb (65.8 kg)   LMP  04/14/2018   SpO2 98%   BMI 24.89 kg/m  Wt Readings from Last 3 Encounters:  04/26/18 145 lb (65.8 kg)  01/14/17 144 lb (65.3 kg)  01/08/17 144 lb 8 oz (65.5 kg)    General: Appears her stated age, well developed, well nourished in NAD. Skin: Warm, dry and intact.  HEENT: Head: normal shape and size; Eyes: sclera white, no icterus, conjunctiva pink, PERRLA and EOMs intact; Ears: Tm's gray and intact, normal light reflex; Throat/Mouth: Teeth present, mucosa pink and moist, no exudate, lesions or ulcerations noted.  Neck:  Neck supple, trachea midline. No masses, lumps or thyromegaly present.  Cardiovascular: Normal rate and rhythm. S1,S2 noted.  No murmur, rubs or gallops noted. No JVD or BLE edema. Pulmonary/Chest: Normal effort and positive vesicular breath sounds. No respiratory distress. No wheezes, rales or ronchi noted.  Abdomen: Soft and nontender. Normal bowel sounds. No distention or masses noted. Liver, spleen and kidneys non palpable. Pelvic: Normal female anatomy. Cervix without changes. No CMT. Scant amount of discharge noted. Adnexa non palpable. Musculoskeletal: Strength 5/5 BUE/BLE. No difficulty with gait.  Neurological: Alert and oriented. Cranial nerves II-XII grossly intact. Coordination normal.  Psychiatric: Mood and affect normal. Behavior is normal. Judgment and thought content normal.    BMET    Component Value Date/Time   NA 141 10/22/2016 0914   K 4.2 10/22/2016 0914   CL 104 10/22/2016 0914   CO2 30 10/22/2016 0914   GLUCOSE 78 10/22/2016 0914   BUN 10 10/22/2016 0914   CREATININE 0.91 10/22/2016 0914   CREATININE 0.83 03/09/2014 1545   CALCIUM 10.1 10/22/2016 0914    Lipid Panel     Component Value Date/Time   CHOL 246 (H) 10/22/2016 0914   TRIG 63.0 10/22/2016 0914   HDL 84.20 10/22/2016 0914   CHOLHDL 3 10/22/2016 0914   VLDL 12.6 10/22/2016 0914   LDLCALC 149 (H) 10/22/2016 0914    CBC    Component Value Date/Time   WBC 5.2 10/22/2016  0914   RBC 4.76 10/22/2016 0914   HGB 14.4 10/22/2016 0914   HCT 42.6 10/22/2016 0914   PLT 270.0 10/22/2016 0914   MCV 89.6 10/22/2016 0914   MCH 30.7 03/09/2014 1545   MCHC 33.9 10/22/2016 0914   RDW 12.4 10/22/2016 0914    Hgb A1C No results found for: HGBA1C         Assessment & Plan:   Preventative Health Maintenance:  Encouraged her to get a flu shot in the fall Td today Pap smear today Screening mammogram ordered- she will call [facility] to schedule,  number provided Encouraged her to consume a balanced diet and exercise regimen Advised her to see an eye doctor and dentist annually Will check CBC, CMET, Lipid and Vit D today  RTC in 1 year, sooner if needed Webb Silversmith, NP

## 2018-04-26 NOTE — Assessment & Plan Note (Signed)
Continue Acyclovir as needed

## 2018-04-26 NOTE — Telephone Encounter (Signed)
That is fine 

## 2018-04-26 NOTE — Telephone Encounter (Signed)
Fine with me

## 2018-04-26 NOTE — Telephone Encounter (Signed)
Can you call patient to schedule appointment with Dorothyann Peng? Thank you.

## 2018-04-27 ENCOUNTER — Other Ambulatory Visit (HOSPITAL_COMMUNITY)
Admission: RE | Admit: 2018-04-27 | Discharge: 2018-04-27 | Disposition: A | Discharge status: Home / Self Care | Payer: BC Managed Care – PPO | Source: Ambulatory Visit | Attending: Family Medicine | Admitting: Family Medicine

## 2018-04-27 DIAGNOSIS — Z124 Encounter for screening for malignant neoplasm of cervix: Secondary | ICD-10-CM | POA: Insufficient documentation

## 2018-04-27 NOTE — Addendum Note (Signed)
Addended by: Lurlean Nanny on: 04/27/2018 10:17 AM   Modules accepted: Orders

## 2018-04-28 LAB — CYTOLOGY - PAP
Adequacy: ABSENT
Diagnosis: NEGATIVE

## 2018-05-02 ENCOUNTER — Encounter: Payer: Self-pay | Admitting: Family Medicine

## 2018-05-02 ENCOUNTER — Ambulatory Visit: Payer: BC Managed Care – PPO | Admitting: Family Medicine

## 2018-05-02 VITALS — BP 106/72 | HR 87 | Temp 98.2°F | Resp 16 | Ht 64.3 in | Wt 145.0 lb

## 2018-05-02 DIAGNOSIS — R05 Cough: Secondary | ICD-10-CM | POA: Diagnosis not present

## 2018-05-02 DIAGNOSIS — R059 Cough, unspecified: Secondary | ICD-10-CM

## 2018-05-02 MED ORDER — BENZONATATE 100 MG PO CAPS: 100.0000 mg | ORAL_CAPSULE | Freq: Three times a day (TID) | ORAL | 0 refills | Status: DC | PRN

## 2018-05-02 NOTE — Patient Instructions (Signed)
Try the benzonate, if not better in a couple of days, let me know, we can add an inhaler   Cough, Adult Coughing is a reflex that clears your throat and your airways. Coughing helps to heal and protect your lungs. It is normal to cough occasionally, but a cough that happens with other symptoms or lasts a long time may be a sign of a condition that needs treatment. A cough may last only 2-3 weeks (acute), or it may last longer than 8 weeks (chronic). What are the causes? Coughing is commonly caused by:  Breathing in substances that irritate your lungs.  A viral or bacterial respiratory infection.  Allergies.  Asthma.  Postnasal drip.  Smoking.  Acid backing up from the stomach into the esophagus (gastroesophageal reflux).  Certain medicines.  Chronic lung problems, including COPD (or rarely, lung cancer).  Other medical conditions such as heart failure.  Follow these instructions at home: Pay attention to any changes in your symptoms. Take these actions to help with your discomfort:  Take medicines only as told by your health care provider. ? If you were prescribed an antibiotic medicine, take it as told by your health care provider. Do not stop taking the antibiotic even if you start to feel better. ? Talk with your health care provider before you take a cough suppressant medicine.  Drink enough fluid to keep your urine clear or pale yellow.  If the air is dry, use a cold steam vaporizer or humidifier in your bedroom or your home to help loosen secretions.  Avoid anything that causes you to cough at work or at home.  If your cough is worse at night, try sleeping in a semi-upright position.  Avoid cigarette smoke. If you smoke, quit smoking. If you need help quitting, ask your health care provider.  Avoid caffeine.  Avoid alcohol.  Rest as needed.  Contact a health care provider if:  You have new symptoms.  You cough up pus.  Your cough does not get better  after 2-3 weeks, or your cough gets worse.  You cannot control your cough with suppressant medicines and you are losing sleep.  You develop pain that is getting worse or pain that is not controlled with pain medicines.  You have a fever.  You have unexplained weight loss.  You have night sweats. Get help right away if:  You cough up blood.  You have difficulty breathing.  Your heartbeat is very fast. This information is not intended to replace advice given to you by your health care provider. Make sure you discuss any questions you have with your health care provider. Document Released: 06/05/2011 Document Revised: 05/14/2016 Document Reviewed: 02/13/2015 Elsevier Interactive Patient Education  [employer].

## 2018-05-02 NOTE — Progress Notes (Signed)
   Subjective:    Patient ID: Linda Fleming, female    DOB: 1976-01-16, 42 y.o.   MRN: [id number]  HPI This is a 42 yo female who presents today with dry cough x 1 week, worse at night. No fever, no runny nose, no ear pain or headache. No SOB/no wheeze. A little sore throat before cough started. Took some left over cough syrup with codeine which did not help.  No acid indigestion or heart burn. No history of asthma, never used inhalers before.   Past Medical History:  Diagnosis Date  . Cancer [facility]) 2009   Melanoma  . Chicken pox    Past Surgical History:  Procedure Laterality Date  . LYMPH GLAND EXCISION  2009  . MELANOMA EXCISION  2009   Family History  Problem Relation Age of Onset  . Hyperlipidemia Mother   . Heart disease Maternal Grandfather   . Cancer Maternal Aunt        melanoma  . Diabetes Neg Hx   . Stroke Neg Hx    Social History   Tobacco Use  . Smoking status: Never Smoker  . Smokeless tobacco: Never Used  Substance Use Topics  . Alcohol use: Yes    Alcohol/week: 0.6 oz    Types: 1 Glasses of wine per week    Comment: occasional  . Drug use: No      Review of Systems Per HPI    Objective:   Physical Exam  Constitutional: She is oriented to person, place, and time. She appears well-developed and well-nourished. No distress.  HENT:  Head: Normocephalic and atraumatic.  Nose: Nose normal.  Mouth/Throat: Oropharynx is clear and moist. No oropharyngeal exudate.  Eyes: Conjunctivae are normal.  Neck: Normal range of motion. Neck supple.  Cardiovascular: Normal rate, regular rhythm and normal heart sounds.  Pulmonary/Chest: Effort normal and breath sounds normal.  Lymphadenopathy:    She has no cervical adenopathy.  Neurological: She is alert and oriented to person, place, and time.  Skin: Skin is warm and dry. She is not diaphoretic.  Psychiatric: She has a normal mood and affect. Her behavior is normal. Judgment and thought content normal.    Vitals reviewed.     BP 106/72 (BP Location: Right Arm, Patient Position: Sitting, Cuff Size: Small)   Pulse 87   Temp 98.2 F (36.8 C) (Oral)   Resp 16   Ht 5' 4.3" (1.633 m)   Wt 145 lb (65.8 kg)   LMP 04/14/2018   SpO2 97%   BMI 24.66 kg/m  Wt Readings from Last 3 Encounters:  05/02/18 145 lb (65.8 kg)  04/26/18 145 lb (65.8 kg)  01/14/17 144 lb (65.3 kg)       Assessment & Plan:  1. Cough - likely post viral - Provided written and verbal information regarding diagnosis and treatment. - discussed cough suppression, avoiding triggers - if not better in a couple of days, will consider adding albuterol inhaler - benzonatate (TESSALON) 100 MG capsule; Take 1-2 capsules (100-200 mg total) by mouth 3 (three) times daily as needed for cough.  Dispense: 40 capsule; Refill: 0   Clarene Reamer, FNP-BC  [facility], [facility]  05/02/2018 12:15 PM

## 2018-05-04 ENCOUNTER — Encounter: Payer: Self-pay | Admitting: Family Medicine

## 2018-05-04 ENCOUNTER — Other Ambulatory Visit: Payer: Self-pay | Admitting: Family Medicine

## 2018-05-04 MED ORDER — ALBUTEROL SULFATE HFA 108 (90 BASE) MCG/ACT IN AERS: 2.0000 | INHALATION_SPRAY | RESPIRATORY_TRACT | 1 refills | Status: DC | PRN

## 2018-05-04 MED ORDER — HYDROCODONE-HOMATROPINE 5-1.5 MG/5ML PO SYRP: 5.0000 mL | ORAL_SOLUTION | Freq: Three times a day (TID) | ORAL | 0 refills | Status: DC | PRN

## 2018-05-04 NOTE — Progress Notes (Signed)
a 

## 2018-07-01 ENCOUNTER — Ambulatory Visit
Admission: RE | Admit: 2018-07-01 | Discharge: 2018-07-01 | Disposition: A | Discharge status: Home / Self Care | Payer: BC Managed Care – PPO | Source: Ambulatory Visit | Attending: Internal Medicine | Admitting: Internal Medicine

## 2018-07-01 DIAGNOSIS — Z1239 Encounter for other screening for malignant neoplasm of breast: Secondary | ICD-10-CM

## 2018-07-12 ENCOUNTER — Ambulatory Visit: Payer: BC Managed Care – PPO | Admitting: Adult Health

## 2018-07-12 ENCOUNTER — Encounter: Payer: Self-pay | Admitting: Adult Health

## 2018-07-12 VITALS — BP 100/68 | Temp 97.4°F | Wt 142.0 lb

## 2018-07-12 DIAGNOSIS — Z0000 Encounter for general adult medical examination without abnormal findings: Secondary | ICD-10-CM

## 2018-07-12 DIAGNOSIS — B009 Herpesviral infection, unspecified: Secondary | ICD-10-CM

## 2018-07-12 NOTE — Progress Notes (Signed)
Patient presents to clinic today to establish care. She is a pleasant 42 year old female who  has a past medical history of Cancer ([place]) (2009) and Chicken pox.  She is a former patient of Webb Silversmith.   Her last CPE was in 04/2018   Acute Concerns: Establish Care  Chronic Issues: HSV - takes Acyclovir as needed. Her last outbreak was 6 months ago.   Health Maintenance: Dental -- Routine  Vision -- Needs  Immunizations -- UTD Colonoscopy -- Never Mammogram -- 06/2018  PAP -- 04/2018  Exercise: Crossfit 4-5 times a week   Diet: Eats healthy   Treatment Team  - Dermatology - Follow yearly    Past Medical History:  Diagnosis Date  . Cancer [facility]) 2009   Melanoma  . Chicken pox     Past Surgical History:  Procedure Laterality Date  . LYMPH GLAND EXCISION  2009  . MELANOMA EXCISION  2009    Current Outpatient Medications on File Prior to Visit  Medication Sig Dispense Refill  . acyclovir (ZOVIRAX) 400 MG tablet TAKE 1 TABLET (400 MG TOTAL) BY MOUTH 2 (TWO) TIMES DAILY. MUST SCHEDULE ANNUAL EXAM 60 tablet 0  . Boric Acid CRYS INSERT VAGINALLY TWICE A WEEK AS DIRECTED. 14400 g 0  . chlorhexidine (PERIDEX) 0.12 % solution     . HYDROcodone-homatropine (HYCODAN) 5-1.5 MG/5ML syrup Take 5 mLs by mouth every 8 (eight) hours as needed for cough. May cause drowsiness. Caution advised when driving/operating heavy machinery. 120 mL 0   No current facility-administered medications on file prior to visit.     No Known Allergies  Family History  Problem Relation Age of Onset  . Hyperlipidemia Mother   . Heart disease Maternal Grandfather   . Cancer Maternal Aunt        melanoma  . Diabetes Neg Hx   . Stroke Neg Hx     Social History   Socioeconomic History  . Marital status: Married    Spouse name: Not on file  . Number of children: Not on file  . Years of education: Not on file  . Highest education level: Not on file  Occupational History  . Not on file    Social Needs  . Financial resource strain: Not on file  . Food insecurity:    Worry: Not on file    Inability: Not on file  . Transportation needs:    Medical: Not on file    Non-medical: Not on file  Tobacco Use  . Smoking status: Never Smoker  . Smokeless tobacco: Never Used  Substance and Sexual Activity  . Alcohol use: Yes    Alcohol/week: 0.6 oz    Types: 1 Glasses of wine per week    Comment: occasional  . Drug use: No  . Sexual activity: Yes  Lifestyle  . Physical activity:    Days per week: Not on file    Minutes per session: Not on file  . Stress: Not on file  Relationships  . Social connections:    Talks on phone: Not on file    Gets together: Not on file    Attends religious service: Not on file    Active member of club or organization: Not on file    Attends meetings of clubs or organizations: Not on file    Relationship status: Not on file  . Intimate partner violence:    Fear of current or ex partner: Not on file    Emotionally  abused: Not on file    Physically abused: Not on file    Forced sexual activity: Not on file  Other Topics Concern  . Not on file  Social History Narrative  . Not on file    Review of Systems  Constitutional: Negative.   HENT: Negative.   Eyes: Negative.   Respiratory: Negative.   Cardiovascular: Negative.   Gastrointestinal: Negative.   Genitourinary: Negative.   Musculoskeletal: Negative.   Skin: Negative.   Neurological: Negative.   Endo/Heme/Allergies: Negative.   Psychiatric/Behavioral: Negative.   All other systems reviewed and are negative.   BP 100/68   Temp (!) 97.4 F (36.3 C) (Oral)   Wt 142 lb (64.4 kg)   BMI 24.15 kg/m   Physical Exam  Constitutional: She is oriented to person, place, and time. She appears well-developed and well-nourished. No distress.  Eyes: Pupils are equal, round, and reactive to light. Conjunctivae and EOM are normal. Right eye exhibits no discharge. Left eye exhibits no  discharge. No scleral icterus.  Neck: Normal range of motion. Neck supple. No JVD present. No tracheal deviation present. No thyromegaly present.  Cardiovascular: Normal rate, regular rhythm, normal heart sounds and intact distal pulses. Exam reveals no gallop and no friction rub.  No murmur heard. Pulmonary/Chest: Effort normal and breath sounds normal. No stridor. No respiratory distress. She has no wheezes. She has no rales. She exhibits no tenderness.  Musculoskeletal: Normal range of motion. She exhibits no edema, tenderness or deformity.  Lymphadenopathy:    She has no cervical adenopathy.  Neurological: She is alert and oriented to person, place, and time. She displays normal reflexes. No cranial nerve deficit or sensory deficit. She exhibits normal muscle tone. Coordination normal.  Skin: Skin is warm and dry. No rash noted. She is not diaphoretic. No erythema. No pallor.  Psychiatric: She has a normal mood and affect. Her behavior is normal. Judgment and thought content normal.  Nursing note and vitals reviewed.   Assessment/Plan: 1. Encounter for medical examination to establish care - Healthy female  - Follow up in May 2020 for CPE or sooner if needed  2. HSV-2 infection Acyclovir PRN   Linda Peng, NP

## 2018-11-01 ENCOUNTER — Ambulatory Visit: Payer: BC Managed Care – PPO | Admitting: Adult Health

## 2018-11-01 VITALS — BP 100/70 | Temp 98.0°F | Wt 149.0 lb

## 2018-11-01 DIAGNOSIS — J029 Acute pharyngitis, unspecified: Secondary | ICD-10-CM | POA: Diagnosis not present

## 2018-11-01 LAB — POCT RAPID STREP A (OFFICE): RAPID STREP A SCREEN: NEGATIVE

## 2018-11-01 NOTE — Progress Notes (Signed)
Subjective:    Patient ID: Linda Fleming, female    DOB: 1976/04/19, 42 y.o.   MRN: [id number]  Sore Throat   This is a new problem. The current episode started today. The problem has been unchanged. There has been no fever. Associated symptoms include headaches. Pertinent negatives include no coughing, drooling, ear discharge, ear pain, shortness of breath, swollen glands, trouble swallowing or vomiting. She has had exposure to strep. She has tried nothing for the symptoms. The treatment provided no relief.    Review of Systems  Constitutional: Negative.   HENT: Positive for sore throat. Negative for drooling, ear discharge, ear pain, postnasal drip, rhinorrhea, sinus pressure, sinus pain and trouble swallowing.   Respiratory: Negative for cough and shortness of breath.   Cardiovascular: Negative.   Gastrointestinal: Negative for vomiting.  Neurological: Positive for headaches.  All other systems reviewed and are negative.  Past Medical History:  Diagnosis Date  . Cancer [facility]) 2009   Melanoma  . Chicken pox   . HSV-2 (herpes simplex virus 2) infection     Social History   Socioeconomic History  . Marital status: Married    Spouse name: Not on file  . Number of children: Not on file  . Years of education: Not on file  . Highest education level: Not on file  Occupational History  . Not on file  Social Needs  . Financial resource strain: Not on file  . Food insecurity:    Worry: Not on file    Inability: Not on file  . Transportation needs:    Medical: Not on file    Non-medical: Not on file  Tobacco Use  . Smoking status: Never Smoker  . Smokeless tobacco: Never Used  Substance and Sexual Activity  . Alcohol use: Yes    Alcohol/week: 1.0 standard drinks    Types: 1 Glasses of wine per week    Comment: occasional  . Drug use: No  . Sexual activity: Yes  Lifestyle  . Physical activity:    Days per week: Not on file    Minutes per session: Not on file  .  Stress: Not on file  Relationships  . Social connections:    Talks on phone: Not on file    Gets together: Not on file    Attends religious service: Not on file    Active member of club or organization: Not on file    Attends meetings of clubs or organizations: Not on file    Relationship status: Not on file  . Intimate partner violence:    Fear of current or ex partner: Not on file    Emotionally abused: Not on file    Physically abused: Not on file    Forced sexual activity: Not on file  Other Topics Concern  . Not on file  Social History Narrative   She works at [employer]    Married    Two children       She likes to do crossfit     Past Surgical History:  Procedure Laterality Date  . LYMPH GLAND EXCISION  2009  . MELANOMA EXCISION  2009    Family History  Problem Relation Age of Onset  . Hyperlipidemia Mother   . Heart disease Maternal Grandfather   . Cancer Maternal Aunt        melanoma  . Alzheimer's disease Paternal Grandmother   . Diabetes Neg Hx   . Stroke Neg Hx  No Known Allergies  Current Outpatient Medications on File Prior to Visit  Medication Sig Dispense Refill  . acyclovir (ZOVIRAX) 400 MG tablet TAKE 1 TABLET (400 MG TOTAL) BY MOUTH 2 (TWO) TIMES DAILY. MUST SCHEDULE ANNUAL EXAM 60 tablet 0   No current facility-administered medications on file prior to visit.     BP 100/70   Temp 98 F (36.7 C)   Wt 149 lb (67.6 kg)   BMI 25.34 kg/m       Objective:   Physical Exam  Constitutional: She appears well-developed and well-nourished.  HENT:  Mouth/Throat: Uvula is midline, oropharynx is clear and moist and mucous membranes are normal. No oropharyngeal exudate, posterior oropharyngeal edema, posterior oropharyngeal erythema or tonsillar abscesses. Tonsils are 0 on the right. Tonsils are 0 on the left. No tonsillar exudate.  Eyes: Pupils are equal, round, and reactive to light. Conjunctivae and EOM are normal.  Neck: Normal  range of motion. Neck supple.  Cardiovascular: Normal rate, regular rhythm, normal heart sounds and intact distal pulses.  Pulmonary/Chest: Effort normal and breath sounds normal.  Nursing note and vitals reviewed.         Assessment & Plan:  1. Sore throat - No signs of strep throat - POC Rapid Strep A- negative  - Advised warm saltwater gargles, honey, lozenges, tylenol or motrin for symptom relief.  - Follow up if not resolved in the next 2-3 days or sooner if fever develops   Dorothyann Peng, NP

## 2018-12-28 ENCOUNTER — Ambulatory Visit: Payer: BC Managed Care – PPO | Admitting: Internal Medicine

## 2019-06-20 ENCOUNTER — Other Ambulatory Visit: Payer: Self-pay

## 2019-06-20 ENCOUNTER — Encounter: Payer: Self-pay | Admitting: Adult Health

## 2019-06-20 ENCOUNTER — Ambulatory Visit (INDEPENDENT_AMBULATORY_CARE_PROVIDER_SITE_OTHER): Payer: BC Managed Care – PPO | Admitting: Adult Health

## 2019-06-20 DIAGNOSIS — J069 Acute upper respiratory infection, unspecified: Secondary | ICD-10-CM | POA: Diagnosis not present

## 2019-06-20 DIAGNOSIS — Z20828 Contact with and (suspected) exposure to other viral communicable diseases: Secondary | ICD-10-CM | POA: Diagnosis not present

## 2019-06-20 NOTE — Progress Notes (Signed)
Virtual Visit via Video Note  I connected with Marija Calamari on 06/20/19 at  4:00 PM EDT by a video enabled telemedicine application and verified that I am speaking with the correct person using two identifiers.  Location patient: home Location provider:work or home office Persons participating in the virtual visit: patient, provider  I discussed the limitations of evaluation and management by telemedicine and the availability of in person appointments. The patient expressed understanding and agreed to proceed.   HPI: 43 year old female who presents to the office today for concern of COVID.  Her symptoms include runny nose, nasal congestion, scratchy throat, intermittent chills, and fatigue.  She does not believe she has had a fever and has no muscle aches or loss of taste or smell.  Symptoms started 3 days ago when she returned from [place] where she was staying with 25 people in the house.  Does report that other people have had similar symptoms as well.   ROS: See pertinent positives and negatives per HPI.  Past Medical History:  Diagnosis Date  . Cancer [facility]) 2009   Melanoma  . Chicken pox   . HSV-2 (herpes simplex virus 2) infection     Past Surgical History:  Procedure Laterality Date  . LYMPH GLAND EXCISION  2009  . MELANOMA EXCISION  2009    Family History  Problem Relation Age of Onset  . Hyperlipidemia Mother   . Heart disease Maternal Grandfather   . Cancer Maternal Aunt        melanoma  . Alzheimer's disease Paternal Grandmother   . Diabetes Neg Hx   . Stroke Neg Hx       Current Outpatient Medications:  .  acyclovir (ZOVIRAX) 400 MG tablet, TAKE 1 TABLET (400 MG TOTAL) BY MOUTH 2 (TWO) TIMES DAILY. MUST SCHEDULE ANNUAL EXAM, Disp: 60 tablet, Rfl: 0  EXAM:  VITALS per patient if applicable:  GENERAL: alert, oriented, appears well and in no acute distress  HEENT: atraumatic, conjunttiva clear, no obvious abnormalities on inspection of external nose and  ears  NECK: normal movements of the head and neck  LUNGS: on inspection no signs of respiratory distress, breathing rate appears normal, no obvious gross SOB, gasping or wheezing  CV: no obvious cyanosis  MS: moves all visible extremities without noticeable abnormality  PSYCH/NEURO: pleasant and cooperative, no obvious depression or anxiety, speech and thought processing grossly intact  ASSESSMENT AND PLAN:  Discussed the following assessment and plan:  URI vs seasonal allergies vs COVID 19.  Will refer to testing at [facility].  She was advised to self quarantine until results come back.  In the meantime she can use Flonase or Claritin.     I discussed the assessment and treatment plan with the patient. The patient was provided an opportunity to ask questions and all were answered. The patient agreed with the plan and demonstrated an understanding of the instructions.   The patient was advised to call back or seek an in-person evaluation if the symptoms worsen or if the condition fails to improve as anticipated.   Dorothyann Peng, NP

## 2019-06-21 ENCOUNTER — Telehealth: Payer: Self-pay | Admitting: *Deleted

## 2019-06-21 ENCOUNTER — Other Ambulatory Visit: Payer: Self-pay

## 2019-06-21 DIAGNOSIS — Z20822 Contact with and (suspected) exposure to covid-19: Secondary | ICD-10-CM

## 2019-06-21 NOTE — Telephone Encounter (Signed)
Pt scheduled today for covid testing today @ [facility] @ 1:45. Instructions given and order placed.

## 2019-06-21 NOTE — Telephone Encounter (Signed)
-----   Message from Hulda Humphrey, [place] sent at 06/20/2019  4:16 PM EDT ----- Regarding: Covid Testing Dorothyann Peng would like this pt scheduled for Covid 19 testing.

## 2019-06-25 LAB — NOVEL CORONAVIRUS, NAA: SARS-CoV-2, NAA: NOT DETECTED

## 2019-07-14 ENCOUNTER — Other Ambulatory Visit: Payer: Self-pay | Admitting: Adult Health

## 2019-07-14 MED ORDER — ALBUTEROL SULFATE HFA 108 (90 BASE) MCG/ACT IN AERS: 2.0000 | INHALATION_SPRAY | Freq: Four times a day (QID) | RESPIRATORY_TRACT | 2 refills | Status: DC | PRN

## 2020-04-23 ENCOUNTER — Other Ambulatory Visit: Payer: Self-pay | Admitting: Adult Health

## 2020-04-23 DIAGNOSIS — Z1231 Encounter for screening mammogram for malignant neoplasm of breast: Secondary | ICD-10-CM

## 2020-04-24 ENCOUNTER — Ambulatory Visit
Admission: RE | Admit: 2020-04-24 | Discharge: 2020-04-24 | Disposition: A | Discharge status: Home / Self Care | Payer: BC Managed Care – PPO | Source: Ambulatory Visit | Attending: Adult Health | Admitting: Adult Health

## 2020-04-24 ENCOUNTER — Other Ambulatory Visit: Payer: Self-pay

## 2020-04-24 DIAGNOSIS — Z1231 Encounter for screening mammogram for malignant neoplasm of breast: Secondary | ICD-10-CM

## 2020-06-04 ENCOUNTER — Institutional Professional Consult (permissible substitution): Payer: BC Managed Care – PPO | Admitting: Plastic Surgery

## 2020-11-12 ENCOUNTER — Ambulatory Visit: Payer: BC Managed Care – PPO | Admitting: Adult Health

## 2020-11-12 ENCOUNTER — Encounter: Payer: Self-pay | Admitting: Adult Health

## 2020-11-12 ENCOUNTER — Other Ambulatory Visit: Payer: Self-pay

## 2020-11-12 VITALS — BP 94/64 | HR 46 | Temp 97.6°F | Ht 64.0 in | Wt 129.4 lb

## 2020-11-12 DIAGNOSIS — R300 Dysuria: Secondary | ICD-10-CM

## 2020-11-12 LAB — POC URINALSYSI DIPSTICK (AUTOMATED)
Bilirubin, UA: NEGATIVE
Blood, UA: NEGATIVE
Glucose, UA: NEGATIVE
Ketones, UA: NEGATIVE
Nitrite, UA: NEGATIVE
Protein, UA: NEGATIVE
Spec Grav, UA: 1.01 (ref 1.010–1.025)
Urobilinogen, UA: 0.2 E.U./dL
pH, UA: 6 (ref 5.0–8.0)

## 2020-11-12 MED ORDER — NITROFURANTOIN MONOHYD MACRO 100 MG PO CAPS: 100.0000 mg | ORAL_CAPSULE | Freq: Two times a day (BID) | ORAL | 0 refills | Status: DC

## 2020-11-12 NOTE — Progress Notes (Signed)
Subjective:    Patient ID: Linda Fleming, female    DOB: August 08, 1976, 44 y.o.   MRN: [id number]  Dysuria  This is a new problem. The current episode started in the past 7 days. The problem occurs every urination. The quality of the pain is described as burning. There has been no fever. She is sexually active. There is no history of pyelonephritis. Associated symptoms include frequency and urgency. Pertinent negatives include no chills, discharge, flank pain, hematuria, hesitancy, nausea, sweats or vomiting. She has tried increased fluids for the symptoms. The treatment provided no relief.      Review of Systems  Constitutional: Negative for chills.  Gastrointestinal: Negative for nausea and vomiting.  Genitourinary: Positive for dysuria, frequency and urgency. Negative for flank pain, hematuria and hesitancy.     Past Medical History:  Diagnosis Date  . Cancer [facility]) 2009   Melanoma  . Chicken pox   . HSV-2 (herpes simplex virus 2) infection     Social History   Socioeconomic History  . Marital status: Married    Spouse name: Not on file  . Number of children: Not on file  . Years of education: Not on file  . Highest education level: Not on file  Occupational History  . Not on file  Tobacco Use  . Smoking status: Never Smoker  . Smokeless tobacco: Never Used  Vaping Use  . Vaping Use: Never used  Substance and Sexual Activity  . Alcohol use: Yes    Alcohol/week: 1.0 standard drink    Types: 1 Glasses of wine per week    Comment: occasional  . Drug use: No  . Sexual activity: Yes  Other Topics Concern  . Not on file  Social History Narrative   She works at [employer]    Married    Two children       She likes to do crossfit    Social Determinants of Radio broadcast assistant Strain:   . Difficulty of Paying Living Expenses: Not on file  Food Insecurity:   . Worried About Charity fundraiser in the Last Year: Not on file  . Ran Out of  Food in the Last Year: Not on file  Transportation Needs:   . Lack of Transportation (Medical): Not on file  . Lack of Transportation (Non-Medical): Not on file  Physical Activity:   . Days of Exercise per Week: Not on file  . Minutes of Exercise per Session: Not on file  Stress:   . Feeling of Stress : Not on file  Social Connections:   . Frequency of Communication with Friends and Family: Not on file  . Frequency of Social Gatherings with Friends and Family: Not on file  . Attends Religious Services: Not on file  . Active Member of Clubs or Organizations: Not on file  . Attends Archivist Meetings: Not on file  . Marital Status: Not on file  Intimate Partner Violence:   . Fear of Current or Ex-Partner: Not on file  . Emotionally Abused: Not on file  . Physically Abused: Not on file  . Sexually Abused: Not on file    Past Surgical History:  Procedure Laterality Date  . LYMPH GLAND EXCISION  2009  . MELANOMA EXCISION  2009    Family History  Problem Relation Age of Onset  . Hyperlipidemia Mother   . Heart disease Maternal Grandfather   . Cancer Maternal Aunt  melanoma  . Alzheimer's disease Paternal Grandmother   . Diabetes Neg Hx   . Stroke Neg Hx     No Known Allergies  Current Outpatient Medications on File Prior to Visit  Medication Sig Dispense Refill  . acyclovir (ZOVIRAX) 400 MG tablet TAKE 1 TABLET (400 MG TOTAL) BY MOUTH 2 (TWO) TIMES DAILY. MUST SCHEDULE ANNUAL EXAM 60 tablet 0   No current facility-administered medications on file prior to visit.    BP 94/64 (BP Location: Left Arm, Patient Position: Sitting, Cuff Size: Normal)   Pulse (!) 46   Temp 97.6 F (36.4 C) (Oral)   Ht 5\' 4"  (1.626 m)   Wt 129 lb 6.4 oz (58.7 kg)   BMI 22.21 kg/m       Objective:   Physical Exam Vitals and nursing note reviewed.  Constitutional:      Appearance: Normal appearance.  Abdominal:     General: Abdomen is flat. Bowel sounds are normal.      Palpations: Abdomen is soft.     Tenderness: There is left CVA tenderness (?). There is no right CVA tenderness.  Skin:    General: Skin is warm and dry.     Capillary Refill: Capillary refill takes less than 2 seconds.  Neurological:     General: No focal deficit present.     Mental Status: She is alert and oriented to person, place, and time.  Psychiatric:        Mood and Affect: Mood normal.        Behavior: Behavior normal.        Thought Content: Thought content normal.        Judgment: Judgment normal.       Assessment & Plan:  1. Dysuria - Will treat due to symptoms and follow up with culture  - POCT Urinalysis Dipstick (Automated) + leuks - nitrofurantoin, macrocrystal-monohydrate, (MACROBID) 100 MG capsule; Take 1 capsule (100 mg total) by mouth 2 (two) times daily.  Dispense: 10 capsule; Refill: 0 - Culture, Urine; Future - Follow up PRN   Dorothyann Peng, NP

## 2020-11-12 NOTE — Addendum Note (Signed)
Addended by: Janann Colonel on: 11/12/2020 01:59 PM   Modules accepted: Orders

## 2020-11-15 LAB — URINE CULTURE
MICRO NUMBER:: 11238960
SPECIMEN QUALITY:: ADEQUATE

## 2020-12-21 HISTORY — PX: AUGMENTATION MAMMAPLASTY: SUR837

## 2021-03-26 ENCOUNTER — Other Ambulatory Visit: Payer: Self-pay | Admitting: Plastic Surgery

## 2021-03-26 DIAGNOSIS — Z1231 Encounter for screening mammogram for malignant neoplasm of breast: Secondary | ICD-10-CM

## 2021-03-27 ENCOUNTER — Other Ambulatory Visit: Payer: Self-pay

## 2021-03-28 ENCOUNTER — Ambulatory Visit: Payer: BC Managed Care – PPO | Admitting: Adult Health

## 2021-03-28 ENCOUNTER — Encounter: Payer: Self-pay | Admitting: Adult Health

## 2021-03-28 VITALS — BP 90/60 | HR 62 | Temp 97.9°F | Ht 64.0 in | Wt 130.6 lb

## 2021-03-28 DIAGNOSIS — Z01818 Encounter for other preprocedural examination: Secondary | ICD-10-CM

## 2021-03-28 NOTE — Progress Notes (Signed)
Subjective:    Patient ID: Linda Fleming, female    DOB: March 23, 1976, 45 y.o.   MRN: [id number]  HPI Extremely healthy 44 year old female who  has a past medical history of Cancer ([place]) (2009), Chicken pox, and HSV-2 (herpes simplex virus 2) infection.  She presents to the office today for surgical clearance for breast augmentation. She has no acute complaints today. Is excited to have the surgery done. She has no questions about the surgery   Surgery is scheduled for 05/20/2021 - pending mammogram that was order.    Review of Systems  Constitutional: Negative.   HENT: Negative.   Eyes: Negative.   Respiratory: Negative.   Cardiovascular: Negative.   Gastrointestinal: Negative.   Endocrine: Negative.   Genitourinary: Negative.   Musculoskeletal: Negative.   Skin: Negative.   Allergic/Immunologic: Negative.   Neurological: Negative.   Hematological: Negative.   Psychiatric/Behavioral: Negative.      Past Medical History:  Diagnosis Date  . Cancer [facility]) 2009   Melanoma  . Chicken pox   . HSV-2 (herpes simplex virus 2) infection     Social History   Socioeconomic History  . Marital status: Married    Spouse name: Not on file  . Number of children: Not on file  . Years of education: Not on file  . Highest education level: Not on file  Occupational History  . Not on file  Tobacco Use  . Smoking status: Never Smoker  . Smokeless tobacco: Never Used  Vaping Use  . Vaping Use: Never used  Substance and Sexual Activity  . Alcohol use: Yes    Alcohol/week: 1.0 standard drink    Types: 1 Glasses of wine per week    Comment: occasional  . Drug use: No  . Sexual activity: Yes  Other Topics Concern  . Not on file  Social History Narrative   She works at [employer]    Married    Two children       She likes to do crossfit    Social Determinants of Radio broadcast assistant Strain: Not on [employer] Insecurity: Not on file  Transportation  Needs: Not on file  Physical Activity: Not on file  Stress: Not on file  Social Connections: Not on file  Intimate Partner Violence: Not on file    Past Surgical History:  Procedure Laterality Date  . LYMPH GLAND EXCISION  2009  . MELANOMA EXCISION  2009    Family History  Problem Relation Age of Onset  . Hyperlipidemia Mother   . Heart disease Maternal Grandfather   . Cancer Maternal Aunt        melanoma  . Alzheimer's disease Paternal Grandmother   . Diabetes Neg Hx   . Stroke Neg Hx     No Known Allergies  No current outpatient medications on file prior to visit.   No current facility-administered medications on file prior to visit.    BP 90/60 (BP Location: Left Arm, Patient Position: Sitting, Cuff Size: Normal)   Pulse 62   Temp 97.9 F (36.6 C) (Oral)   Ht 5\' 4"  (1.626 m)   Wt 130 lb 9.6 oz (59.2 kg)   SpO2 98%   BMI 22.42 kg/m       Objective:   Physical Exam Vitals and nursing note reviewed.  Constitutional:      Appearance: Normal appearance.  HENT:     Mouth/Throat:     Mouth: Mucous membranes  are moist.     Pharynx: Oropharynx is clear.  Eyes:     Extraocular Movements: Extraocular movements intact.     Conjunctiva/sclera: Conjunctivae normal.     Pupils: Pupils are equal, round, and reactive to light.  Cardiovascular:     Rate and Rhythm: Normal rate and regular rhythm.     Pulses: Normal pulses.     Heart sounds: Normal heart sounds.  Pulmonary:     Effort: Pulmonary effort is normal.     Breath sounds: Normal breath sounds.  Abdominal:     General: Abdomen is flat.     Palpations: Abdomen is soft.  Musculoskeletal:        General: Normal range of motion.  Skin:    General: Skin is warm and dry.     Capillary Refill: Capillary refill takes less than 2 seconds.  Neurological:     General: No focal deficit present.     Mental Status: She is alert and oriented to person, place, and time.  Psychiatric:        Mood and Affect: Mood  normal.        Behavior: Behavior normal.        Thought Content: Thought content normal.        Judgment: Judgment normal.       Assessment & Plan:  1. Pre-operative clearance - Pending mammogram she is cleared for surgery. Low risk  - Follow up PRN   Dorothyann Peng, NP

## 2021-05-15 ENCOUNTER — Other Ambulatory Visit: Payer: Self-pay

## 2021-05-15 ENCOUNTER — Ambulatory Visit
Admission: RE | Admit: 2021-05-15 | Discharge: 2021-05-15 | Disposition: A | Discharge status: Home / Self Care | Payer: BC Managed Care – PPO | Source: Ambulatory Visit | Attending: Plastic Surgery | Admitting: Plastic Surgery

## 2021-05-15 DIAGNOSIS — Z1231 Encounter for screening mammogram for malignant neoplasm of breast: Secondary | ICD-10-CM

## 2021-12-03 DIAGNOSIS — D225 Melanocytic nevi of trunk: Secondary | ICD-10-CM | POA: Diagnosis not present

## 2021-12-03 DIAGNOSIS — L219 Seborrheic dermatitis, unspecified: Secondary | ICD-10-CM | POA: Diagnosis not present

## 2021-12-03 DIAGNOSIS — L821 Other seborrheic keratosis: Secondary | ICD-10-CM | POA: Diagnosis not present

## 2021-12-03 DIAGNOSIS — L578 Other skin changes due to chronic exposure to nonionizing radiation: Secondary | ICD-10-CM | POA: Diagnosis not present

## 2022-03-31 DIAGNOSIS — Z79890 Hormone replacement therapy (postmenopausal): Secondary | ICD-10-CM | POA: Diagnosis not present

## 2022-03-31 DIAGNOSIS — Z01419 Encounter for gynecological examination (general) (routine) without abnormal findings: Secondary | ICD-10-CM | POA: Diagnosis not present

## 2022-03-31 DIAGNOSIS — Z6823 Body mass index (BMI) 23.0-23.9, adult: Secondary | ICD-10-CM | POA: Diagnosis not present

## 2022-04-29 DIAGNOSIS — Z1231 Encounter for screening mammogram for malignant neoplasm of breast: Secondary | ICD-10-CM | POA: Diagnosis not present

## 2022-05-04 ENCOUNTER — Other Ambulatory Visit: Payer: Self-pay | Admitting: Obstetrics and Gynecology

## 2022-05-04 DIAGNOSIS — R928 Other abnormal and inconclusive findings on diagnostic imaging of breast: Secondary | ICD-10-CM

## 2022-05-14 ENCOUNTER — Other Ambulatory Visit: Payer: Self-pay | Admitting: Obstetrics and Gynecology

## 2022-05-14 ENCOUNTER — Ambulatory Visit
Admission: RE | Admit: 2022-05-14 | Discharge: 2022-05-14 | Disposition: A | Discharge status: Home / Self Care | Payer: Self-pay | Source: Ambulatory Visit | Attending: Obstetrics and Gynecology | Admitting: Obstetrics and Gynecology

## 2022-05-14 ENCOUNTER — Ambulatory Visit
Admission: RE | Admit: 2022-05-14 | Discharge: 2022-05-14 | Disposition: A | Discharge status: Home / Self Care | Payer: BC Managed Care – PPO | Source: Ambulatory Visit | Attending: Obstetrics and Gynecology | Admitting: Obstetrics and Gynecology

## 2022-05-14 DIAGNOSIS — R928 Other abnormal and inconclusive findings on diagnostic imaging of breast: Secondary | ICD-10-CM | POA: Diagnosis not present

## 2022-05-14 DIAGNOSIS — N6489 Other specified disorders of breast: Secondary | ICD-10-CM | POA: Diagnosis not present

## 2022-05-14 DIAGNOSIS — N6001 Solitary cyst of right breast: Secondary | ICD-10-CM

## 2022-06-05 DIAGNOSIS — F418 Other specified anxiety disorders: Secondary | ICD-10-CM | POA: Diagnosis not present

## 2022-06-15 DIAGNOSIS — F418 Other specified anxiety disorders: Secondary | ICD-10-CM | POA: Diagnosis not present

## 2022-07-06 DIAGNOSIS — F418 Other specified anxiety disorders: Secondary | ICD-10-CM | POA: Diagnosis not present

## 2022-07-27 DIAGNOSIS — F418 Other specified anxiety disorders: Secondary | ICD-10-CM | POA: Diagnosis not present

## 2022-08-21 DIAGNOSIS — F418 Other specified anxiety disorders: Secondary | ICD-10-CM | POA: Diagnosis not present

## 2022-09-17 ENCOUNTER — Ambulatory Visit (INDEPENDENT_AMBULATORY_CARE_PROVIDER_SITE_OTHER): Payer: BC Managed Care – PPO | Admitting: Adult Health

## 2022-09-17 ENCOUNTER — Encounter: Payer: Self-pay | Admitting: Adult Health

## 2022-09-17 VITALS — BP 100/60 | HR 73 | Temp 97.7°F | Ht 63.5 in | Wt 134.0 lb

## 2022-09-17 DIAGNOSIS — Z1159 Encounter for screening for other viral diseases: Secondary | ICD-10-CM | POA: Diagnosis not present

## 2022-09-17 DIAGNOSIS — Z0000 Encounter for general adult medical examination without abnormal findings: Secondary | ICD-10-CM | POA: Diagnosis not present

## 2022-09-17 DIAGNOSIS — Z1211 Encounter for screening for malignant neoplasm of colon: Secondary | ICD-10-CM

## 2022-09-17 DIAGNOSIS — Z23 Encounter for immunization: Secondary | ICD-10-CM | POA: Diagnosis not present

## 2022-09-17 LAB — LIPID PANEL
Cholesterol: 248 mg/dL — ABNORMAL HIGH (ref 0–200)
HDL: 100.4 mg/dL (ref >39.00)
LDL Cholesterol: 136 mg/dL — ABNORMAL HIGH (ref 0–99)
NonHDL: 147.39
Total CHOL/HDL Ratio: 2
Triglycerides: 55 mg/dL (ref 0.0–149.0)
VLDL: 11 mg/dL (ref 0.0–40.0)

## 2022-09-17 LAB — COMPREHENSIVE METABOLIC PANEL
ALT: 18 U/L (ref 0–35)
AST: 20 U/L (ref 0–37)
Albumin: 4.5 g/dL (ref 3.5–5.2)
Alkaline Phosphatase: 31 U/L — ABNORMAL LOW (ref 39–117)
BUN: 19 mg/dL (ref 6–23)
CO2: 28 mEq/L (ref 19–32)
Calcium: 9.5 mg/dL (ref 8.4–10.5)
Chloride: 102 mEq/L (ref 96–112)
Creatinine, Ser: 1.08 mg/dL (ref 0.40–1.20)
GFR: 62.27 mL/min (ref >60.00)
Glucose, Bld: 76 mg/dL (ref 70–99)
Potassium: 4.5 mEq/L (ref 3.5–5.1)
Sodium: 137 mEq/L (ref 135–145)
Total Bilirubin: 0.4 mg/dL (ref 0.2–1.2)
Total Protein: 7.3 g/dL (ref 6.0–8.3)

## 2022-09-17 LAB — CBC WITH DIFFERENTIAL/PLATELET
Basophils Absolute: 0 10*3/uL (ref 0.0–0.1)
Basophils Relative: 0.6 % (ref 0.0–3.0)
Eosinophils Absolute: 0.1 10*3/uL (ref 0.0–0.7)
Eosinophils Relative: 1.2 % (ref 0.0–5.0)
HCT: 40.2 % (ref 36.0–46.0)
Hemoglobin: 13.8 g/dL (ref 12.0–15.0)
Lymphocytes Relative: 20.6 % (ref 12.0–46.0)
Lymphs Abs: 1.2 10*3/uL (ref 0.7–4.0)
MCHC: 34.3 g/dL (ref 30.0–36.0)
MCV: 91.6 fl (ref 78.0–100.0)
Monocytes Absolute: 0.5 10*3/uL (ref 0.1–1.0)
Monocytes Relative: 7.8 % (ref 3.0–12.0)
Neutro Abs: 4.2 10*3/uL (ref 1.4–7.7)
Neutrophils Relative %: 69.8 % (ref 43.0–77.0)
Platelets: 254 10*3/uL (ref 150.0–400.0)
RBC: 4.39 Mil/uL (ref 3.87–5.11)
RDW: 12.2 % (ref 11.5–15.5)
WBC: 5.9 10*3/uL (ref 4.0–10.5)

## 2022-09-17 LAB — TSH: TSH: 1.99 u[IU]/mL (ref 0.35–5.50)

## 2022-09-17 NOTE — Patient Instructions (Signed)
It was great seeing you today   We will follow up with you regarding your lab work   Please let me know if you need anything   

## 2022-09-17 NOTE — Progress Notes (Signed)
Subjective:    Patient ID: Linda Fleming, female    DOB: 1976/11/08, 46 y.o.   MRN: [id number]  HPI Patient presents for yearly preventative medicine examination. She is a pleasant 46 year old female who  has a past medical history of Cancer ([place]) (2009), Chicken pox, and HSV-2 (herpes simplex virus 2) infection.  HSV 2 - takes Acyclovir 400 mg BID   All immunizations and health maintenance protocols were reviewed with the patient and needed orders were placed.  Appropriate screening laboratory values were ordered for the patient including screening of hyperlipidemia, renal function and hepatic function.   Medication reconciliation,  past medical history, social history, problem list and allergies were reviewed in detail with the patient  Goals were established with regard to weight loss, exercise, and  diet in compliance with medications. She is eating healthy and exercising  Wt Readings from Last 3 Encounters:  09/17/22 134 lb (60.8 kg)  03/28/21 130 lb 9.6 oz (59.2 kg)  11/12/20 129 lb 6.4 oz (58.7 kg)   She is going to be due for routine colon cancer screening. Reports family history of precancerous polyps   Review of Systems  Constitutional: Negative.   HENT: Negative.    Eyes: Negative.   Respiratory: Negative.    Cardiovascular: Negative.   Gastrointestinal: Negative.   Endocrine: Negative.   Genitourinary: Negative.   Musculoskeletal: Negative.   Skin: Negative.   Allergic/Immunologic: Negative.   Neurological: Negative.   Hematological: Negative.   Psychiatric/Behavioral: Negative.     Past Medical History:  Diagnosis Date   Cancer ([place]) 2009   Melanoma   Chicken pox    HSV-2 (herpes simplex virus 2) infection     Social History   Socioeconomic History   Marital status: Divorced    Spouse name: Not on file   Number of children: Not on file   Years of education: Not on file   Highest education level: Not on file  Occupational History    Not on file  Tobacco Use   Smoking status: Never   Smokeless tobacco: Never  Vaping Use   Vaping Use: Never used  Substance and Sexual Activity   Alcohol use: Yes    Alcohol/week: 1.0 standard drink of alcohol    Types: 1 Glasses of wine per week    Comment: occasional   Drug use: No   Sexual activity: Yes  Other Topics Concern   Not on file  Social History Narrative   She is working for a non profit    Two children       She likes to do crossfit    Social Determinants of Radio broadcast assistant Strain: Not on file  Food Insecurity: Not on file  Transportation Needs: Not on file  Physical Activity: Not on file  Stress: Not on file  Social Connections: Not on file  Intimate Partner Violence: Not on file    Past Surgical History:  Procedure Laterality Date   LYMPH GLAND EXCISION  2009   MELANOMA EXCISION  2009    Family History  Problem Relation Age of Onset   Hyperlipidemia Mother    Heart disease Maternal Grandfather    Cancer Maternal Aunt        melanoma   Alzheimer's disease Paternal Grandmother    Diabetes Neg Hx    Stroke Neg Hx     No Known Allergies  Current Outpatient Medications on File Prior to Visit  Medication Sig Dispense Refill  acyclovir (ZOVIRAX) 400 MG tablet TAKE 1 TABLET BY MOUTH TWICE A DAY AS DIRECTED     Bacillus Coagulans-Inulin (PROBIOTIC) 1-250 BILLION-MG CAPS Probiotic     Emollient (AMBI EVEN & CLEAR SPF 30 EX) apply topically to sun exposed areas Daily for 30     estradiol (ESTRACE) 2 MG tablet Take 2 mg by mouth daily.     Magnesium 250 MG TABS Take by mouth.     progesterone (PROMETRIUM) 100 MG capsule Take 2 capsules by mouth at bedtime.     VITAMIN D PO Take 1,000 Int'l Units/day by mouth.     No current facility-administered medications on file prior to visit.    BP 100/60   Pulse 73   Temp 97.7 F (36.5 C) (Oral)   Ht 5' 3.5" (1.613 m)   Wt 134 lb (60.8 kg)   LMP 09/17/2018   SpO2 99%   BMI 23.36 kg/m        Objective:   Physical Exam Vitals and nursing note reviewed.  Constitutional:      General: She is not in acute distress.    Appearance: Normal appearance. She is well-developed. She is not ill-appearing.  HENT:     Head: Normocephalic and atraumatic.     Right Ear: Tympanic membrane, ear canal and external ear normal. There is no impacted cerumen.     Left Ear: Tympanic membrane, ear canal and external ear normal. There is no impacted cerumen.     Nose: Nose normal. No congestion or rhinorrhea.     Mouth/Throat:     Mouth: Mucous membranes are moist.     Pharynx: Oropharynx is clear. No oropharyngeal exudate or posterior oropharyngeal erythema.  Eyes:     General:        Right eye: No discharge.        Left eye: No discharge.     Extraocular Movements: Extraocular movements intact.     Conjunctiva/sclera: Conjunctivae normal.     Pupils: Pupils are equal, round, and reactive to light.  Neck:     Thyroid: No thyromegaly.     Vascular: No carotid bruit.     Trachea: No tracheal deviation.  Cardiovascular:     Rate and Rhythm: Normal rate and regular rhythm.     Pulses: Normal pulses.     Heart sounds: Normal heart sounds. No murmur heard.    No friction rub. No gallop.  Pulmonary:     Effort: Pulmonary effort is normal. No respiratory distress.     Breath sounds: Normal breath sounds. No stridor. No wheezing, rhonchi or rales.  Chest:     Chest wall: No tenderness.  Abdominal:     General: Abdomen is flat. Bowel sounds are normal. There is no distension.     Palpations: Abdomen is soft. There is no mass.     Tenderness: There is no abdominal tenderness. There is no right CVA tenderness, left CVA tenderness, guarding or rebound.     Hernia: No hernia is present.  Musculoskeletal:        General: No swelling, tenderness, deformity or signs of injury. Normal range of motion.     Cervical back: Normal range of motion and neck supple.     Right lower leg: No edema.      Left lower leg: No edema.  Lymphadenopathy:     Cervical: No cervical adenopathy.  Skin:    General: Skin is warm and dry.     Coloration: Skin is not jaundiced  or pale.     Findings: No bruising, erythema, lesion or rash.  Neurological:     General: No focal deficit present.     Mental Status: She is alert and oriented to person, place, and time.     Cranial Nerves: No cranial nerve deficit.     Sensory: No sensory deficit.     Motor: No weakness.     Coordination: Coordination normal.     Gait: Gait normal.     Deep Tendon Reflexes: Reflexes normal.  Psychiatric:        Mood and Affect: Mood normal.        Behavior: Behavior normal.        Thought Content: Thought content normal.        Judgment: Judgment normal.       Assessment & Plan:  1. Routine general medical examination at a health care facility - Benign exam. Very healthy 46 year old female  - CBC with Differential/Platelet; Future - Comprehensive metabolic panel; Future - Lipid panel; Future - TSH; Future  2. Need for hepatitis C screening test  - Hep C Antibody; Future  3. Colon cancer screening  - Ambulatory referral to Gastroenterology  Dorothyann Peng, NP

## 2022-09-18 LAB — HEPATITIS C ANTIBODY: Hepatitis C Ab: NONREACTIVE

## 2022-11-17 ENCOUNTER — Ambulatory Visit
Admission: RE | Admit: 2022-11-17 | Discharge: 2022-11-17 | Disposition: A | Discharge status: Home / Self Care | Payer: BC Managed Care – PPO | Source: Ambulatory Visit | Attending: Obstetrics and Gynecology | Admitting: Obstetrics and Gynecology

## 2022-11-17 ENCOUNTER — Other Ambulatory Visit: Payer: Self-pay | Admitting: Obstetrics and Gynecology

## 2022-11-17 DIAGNOSIS — N6313 Unspecified lump in the right breast, lower outer quadrant: Secondary | ICD-10-CM | POA: Diagnosis not present

## 2022-11-17 DIAGNOSIS — N6001 Solitary cyst of right breast: Secondary | ICD-10-CM

## 2022-11-17 DIAGNOSIS — R92331 Mammographic heterogeneous density, right breast: Secondary | ICD-10-CM | POA: Diagnosis not present

## 2022-11-17 DIAGNOSIS — N6314 Unspecified lump in the right breast, lower inner quadrant: Secondary | ICD-10-CM | POA: Diagnosis not present

## 2022-11-20 ENCOUNTER — Ambulatory Visit (AMBULATORY_SURGERY_CENTER): Payer: BC Managed Care – PPO | Admitting: *Deleted

## 2022-11-20 VITALS — Ht 64.0 in | Wt 138.0 lb

## 2022-11-20 DIAGNOSIS — Z1211 Encounter for screening for malignant neoplasm of colon: Secondary | ICD-10-CM

## 2022-11-20 MED ORDER — NA SULFATE-K SULFATE-MG SULF 17.5-3.13-1.6 GM/177ML PO SOLN: 1.0000 | Freq: Once | ORAL | 0 refills | Status: AC

## 2022-11-20 NOTE — Progress Notes (Signed)

## 2022-11-30 ENCOUNTER — Encounter: Payer: Self-pay | Admitting: Gastroenterology

## 2022-12-03 ENCOUNTER — Encounter: Payer: Self-pay | Admitting: Gastroenterology

## 2022-12-03 ENCOUNTER — Ambulatory Visit (AMBULATORY_SURGERY_CENTER): Payer: BC Managed Care – PPO | Admitting: Gastroenterology

## 2022-12-03 VITALS — BP 106/58 | HR 63 | Temp 98.0°F | Resp 16 | Ht 63.5 in | Wt 138.0 lb

## 2022-12-03 DIAGNOSIS — K635 Polyp of colon: Secondary | ICD-10-CM | POA: Diagnosis not present

## 2022-12-03 DIAGNOSIS — D123 Benign neoplasm of transverse colon: Secondary | ICD-10-CM

## 2022-12-03 DIAGNOSIS — Z1211 Encounter for screening for malignant neoplasm of colon: Secondary | ICD-10-CM

## 2022-12-03 MED ORDER — SODIUM CHLORIDE 0.9 % IV SOLN: 500.0000 mL | Freq: Once | INTRAVENOUS | Status: DC

## 2022-12-03 NOTE — Progress Notes (Signed)
Report to PACU, RN, vss, BBS= Clear.  

## 2022-12-03 NOTE — Op Note (Signed)
[place] Patient Name: Linda Fleming Procedure Date: 12/03/2022 8:41 AM MRN: [id number] Endoscopist: Thornton Park MD, MD, [id number] Age: 46 Referring MD:  Date of Birth: 1976/08/04 Gender: Female Account #: [id number] Procedure:                Colonoscopy Indications:              Screening for colorectal malignant neoplasm, This                            is the patient's first colonoscopy                           Mother with colon polyps Medicines:                Monitored Anesthesia Care Procedure:                Pre-Anesthesia Assessment:                           - Prior to the procedure, a History and Physical                            was performed, and patient medications and                            allergies were reviewed. The patient's tolerance of                            previous anesthesia was also reviewed. The risks                            and benefits of the procedure and the sedation                            options and risks were discussed with the patient.                            All questions were answered, and informed consent                            was obtained. Prior Anticoagulants: The patient has                            taken no anticoagulant or antiplatelet agents. ASA                            Grade Assessment: II - A patient with mild systemic                            disease. After reviewing the risks and benefits,                            the patient was deemed in satisfactory condition to  undergo the procedure.                           After obtaining informed consent, the colonoscope                            was passed under direct vision. Throughout the                            procedure, the patient's blood pressure, pulse, and                            oxygen saturations were monitored continuously. The                            CF [id number] #[id number] was introduced  through the anus                            and advanced to the 3 cm into the ileum. A second                            forward view of the right colon was performed. The                            colonoscopy was performed without difficulty. The                            patient tolerated the procedure well. The quality                            of the bowel preparation was excellent. The                            terminal ileum, ileocecal valve, appendiceal                            orifice, and rectum were photographed. Scope In: 8:45:45 AM Scope Out: 9:01:17 AM Scope Withdrawal Time: 0 hours 9 minutes 59 seconds  Total Procedure Duration: 0 hours 15 minutes 32 seconds  Findings:                 The perianal and digital rectal examinations were                            normal.                           A 5 mm polyp was found in the splenic flexure. The                            polyp was sessile. The polyp was removed with a                            cold snare. Resection and retrieval were complete.  Estimated blood loss was minimal.                           The exam was otherwise without abnormality on                            direct and retroflexion views. Complications:            No immediate complications. Estimated Blood Loss:     Estimated blood loss was minimal. Impression:               - One 5 mm polyp at the splenic flexure, removed                            with a cold snare. Resected and retrieved.                           - The examination was otherwise normal on direct                            and retroflexion views. Recommendation:           - Patient has a contact number available for                            emergencies. The signs and symptoms of potential                            delayed complications were discussed with the                            patient. Return to normal activities tomorrow.                             Written discharge instructions were provided to the                            patient.                           - Resume previous diet.                           - Continue present medications.                           - Await pathology results.                           - Repeat colonoscopy date to be determined after                            pending pathology results are reviewed for                            surveillance.                           -  Emerging evidence supports eating a diet of                            fruits, vegetables, grains, calcium, and yogurt                            while reducing red meat and alcohol may reduce the                            risk of colon cancer.                           - Thank you for allowing me to be involved in your                            colon cancer prevention. Thornton Park MD, MD 12/03/2022 9:07:12 AM This report has been signed electronically.

## 2022-12-03 NOTE — Patient Instructions (Addendum)
Thank you for letting us take care of your healthcare needs today. Please see handouts given to you on Polyps. You may resume previous medications.    YOU HAD AN ENDOSCOPIC PROCEDURE TODAY AT [facility]:   Refer to the procedure report that was given to you for any specific questions about what was found during the examination.  If the procedure report does not answer your questions, please call your gastroenterologist to clarify.  If you requested that your care partner not be given the details of your procedure findings, then the procedure report has been included in a sealed envelope for you to review at your convenience later.  YOU SHOULD EXPECT: Some feelings of bloating in the abdomen. Passage of more gas than usual.  Walking can help get rid of the air that was put into your GI tract during the procedure and reduce the bloating. If you had a lower endoscopy (such as a colonoscopy or flexible sigmoidoscopy) you may notice spotting of blood in your stool or on the toilet paper. If you underwent a bowel prep for your procedure, you may not have a normal bowel movement for a few days.  Please Note:  You might notice some irritation and congestion in your nose or some drainage.  This is from the oxygen used during your procedure.  There is no need for concern and it should clear up in a day or so.  SYMPTOMS TO REPORT IMMEDIATELY:  Following lower endoscopy (colonoscopy or flexible sigmoidoscopy):  Excessive amounts of blood in the stool  Significant tenderness or worsening of abdominal pains  Swelling of the abdomen that is new, acute  Fever of 100F or higher  For urgent or emergent issues, a gastroenterologist can be reached at any hour by calling [phone]. Do not use MyChart messaging for urgent concerns.    DIET:  We do recommend a small meal at first, but then you may proceed to your regular diet.  Drink plenty of fluids but you should avoid alcoholic beverages  for 24 hours.  ACTIVITY:  You should plan to take it easy for the rest of today and you should NOT DRIVE or use heavy machinery until tomorrow (because of the sedation medicines used during the test).    FOLLOW UP: Our staff will call the number listed on your records the next business day following your procedure.  We will call around 7:15- 8:00 am to check on you and address any questions or concerns that you may have regarding the information given to you following your procedure. If we do not reach you, we will leave a message.     If any biopsies were taken you will be contacted by phone or by letter within the next 1-3 weeks.  Please call us at [phone] if you have not heard about the biopsies in 3 weeks.    SIGNATURES/CONFIDENTIALITY: You and/or your care partner have signed paperwork which will be entered into your electronic medical record.  These signatures attest to the fact that that the information above on your After Visit Summary has been reviewed and is understood.  Full responsibility of the confidentiality of this discharge information lies with you and/or your care-partner.

## 2022-12-03 NOTE — Progress Notes (Signed)
   Referring Provider: Dorothyann Peng, NP Primary Care Physician:  Dorothyann Peng, NP  Indication for Colonoscopy:  Colon cancer screening   IMPRESSION:  Need for colon cancer screening Appropriate candidate for monitored anesthesia care  PLAN: Colonoscopy in the [facility] today   HPI: Linda Fleming is a 46 y.o. female presents for screening colonoscopy.  No prior colonoscopy or colon cancer screening.  Mother with colon polyps. No other known family history of colon cancer or polyps. No family history of uterine/endometrial cancer, pancreatic cancer or gastric/stomach cancer.   Past Medical History:  Diagnosis Date   Cancer ([place]) 2009   Melanoma   Chicken pox    HSV-2 (herpes simplex virus 2) infection     Past Surgical History:  Procedure Laterality Date   AUGMENTATION MAMMAPLASTY Bilateral 2022   LYMPH GLAND EXCISION  12/22/2007   MELANOMA EXCISION  12/22/2007    Current Outpatient Medications  Medication Sig Dispense Refill   acyclovir (ZOVIRAX) 400 MG tablet as needed.     Bacillus Coagulans-Inulin (PROBIOTIC) 1-250 BILLION-MG CAPS Probiotic     Emollient (AMBI EVEN & CLEAR SPF 30 EX) apply topically to sun exposed areas Daily for 30     estradiol (ESTRACE) 2 MG tablet Take 2 mg by mouth daily.     Magnesium 250 MG TABS Take by mouth.     progesterone (PROMETRIUM) 100 MG capsule Take 2 capsules by mouth at bedtime.     VITAMIN D PO Take 1,000 Int'l Units/day by mouth.     Current Facility-Administered Medications  Medication Dose Route Frequency Provider Last Rate Last Admin   0.9 %  sodium chloride infusion  500 mL Intravenous Once Thornton Park, MD        Allergies as of 12/03/2022   (No Known Allergies)    Family History  Problem Relation Age of Onset   Colon polyps Mother    Hyperlipidemia Mother    Cancer Maternal Aunt        melanoma   Crohn's disease Maternal Uncle    Heart disease Maternal Grandfather    Alzheimer's disease Paternal  Grandmother    Diabetes Neg Hx    Stroke Neg Hx    Colon cancer Neg Hx    Esophageal cancer Neg Hx    Rectal cancer Neg Hx    Stomach cancer Neg Hx    Ulcerative colitis Neg Hx      Physical Exam: General:   Alert,  well-nourished, pleasant and cooperative in NAD Head:  Normocephalic and atraumatic. Eyes:  Sclera clear, no icterus.   Conjunctiva pink. Mouth:  No deformity or lesions.   Neck:  Supple; no masses or thyromegaly. Lungs:  Clear throughout to auscultation.   No wheezes. Heart:  Regular rate and rhythm; no murmurs. Abdomen:  Soft, non-tender, nondistended, normal bowel sounds, no rebound or guarding.  Msk:  Symmetrical. No boney deformities LAD: No inguinal or umbilical LAD Extremities:  No clubbing or edema. Neurologic:  Alert and  oriented x4;  grossly nonfocal Skin:  No obvious rash or bruise. Psych:  Alert and cooperative. Normal mood and affect.     Studies/Results: No results found.    Yatzary Merriweather L. Tarri Glenn, MD, MPH 12/03/2022, 8:15 AM

## 2022-12-03 NOTE — Progress Notes (Signed)
Pt's states no medical or surgical changes since previsit or office visit. 

## 2022-12-03 NOTE — Progress Notes (Signed)
Called to room to assist during endoscopic procedure.  Patient ID and intended procedure confirmed with present staff. Received instructions for my participation in the procedure from the performing physician.  

## 2022-12-04 ENCOUNTER — Telehealth: Payer: Self-pay

## 2022-12-04 NOTE — Telephone Encounter (Signed)
Attempted to reach patient for post-procedure f/u call. No answer. Left message for her to please note hesitate to call us if she has any questions/concerns regarding her care.

## 2022-12-17 ENCOUNTER — Encounter: Payer: Self-pay | Admitting: Gastroenterology

## 2023-03-10 IMAGING — MG MM DIGITAL DIAGNOSTIC UNILAT*R* IMPLANT W/ TOMO W/ CAD
8 series · 8 of 24 positions shown · non-contrast
Comparison: Previous exam(s).

CLINICAL DATA: Screening recall for 2 right breast asymmetries. The
patient had implants placed in the [REDACTED] of 7577 after her last
mammogram.

EXAM:
DIGITAL DIAGNOSTIC UNILATERAL RIGHT MAMMOGRAM WITH IMPLANTS, CAD AND
TOMOSYNTHESIS; ULTRASOUND RIGHT BREAST LIMITED
TECHNIQUE: Right digital diagnostic mammography and breast tomosynthesis was
performed. The images were evaluated with computer-aided detection.
Standard and/or implant displaced views were performed.; Targeted
ultrasound examination of the right breast was performed

[R CC synth-2D (1 of 2)]
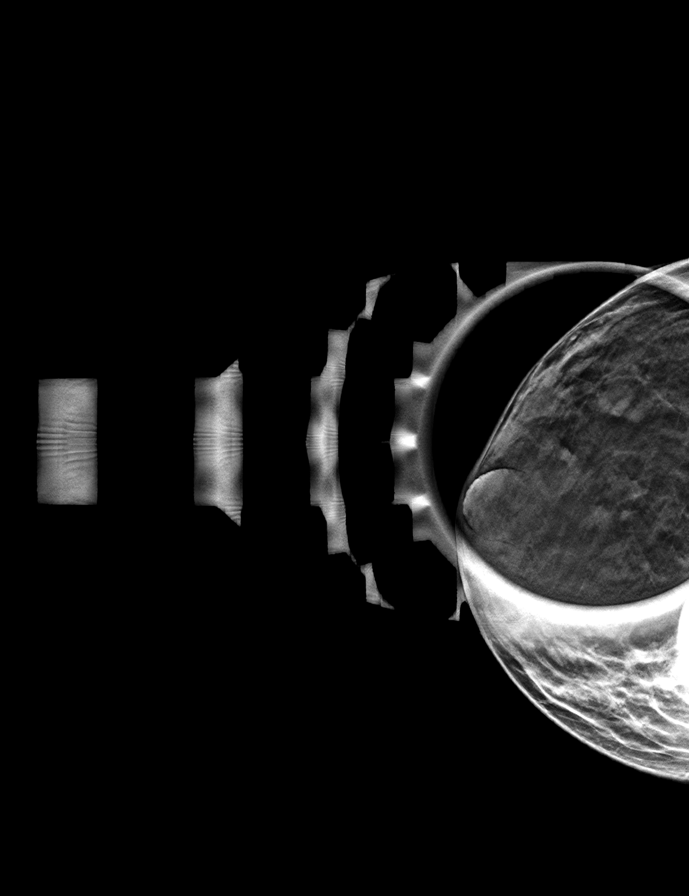

[R CC synth-2D (2 of 2)]
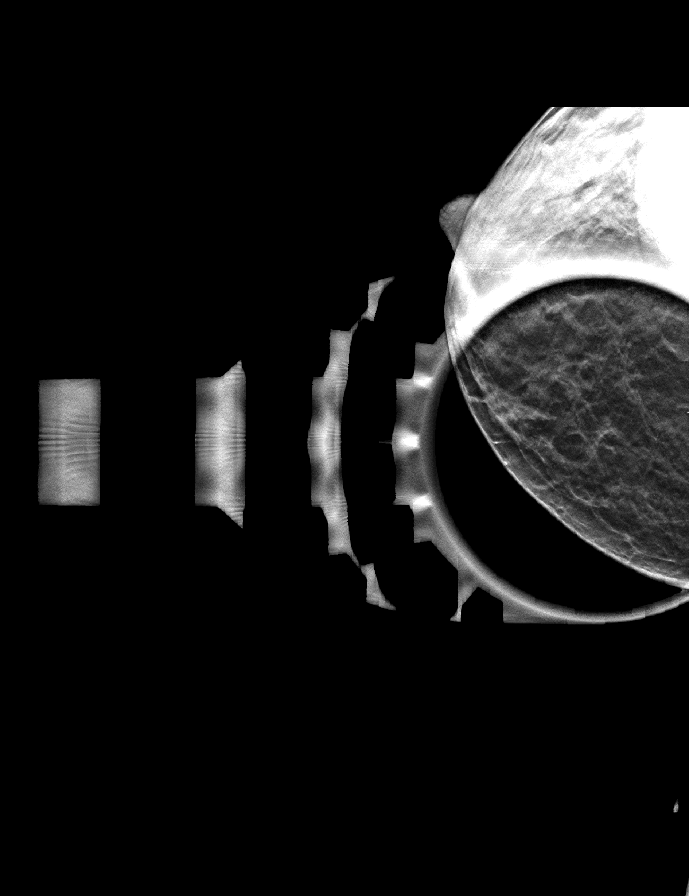

[R ML synth-2D]
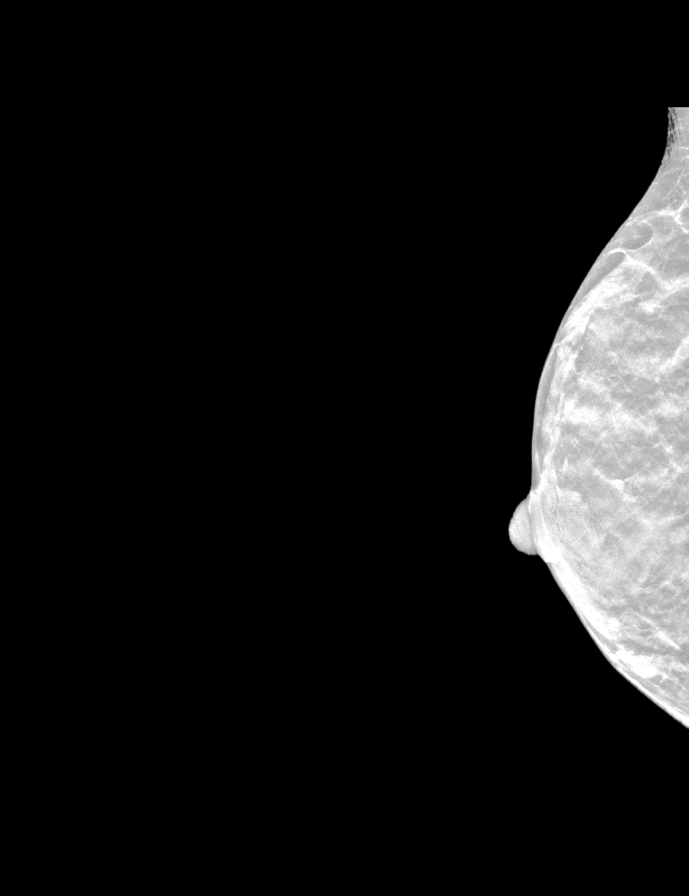

[R MLO synth-2D]
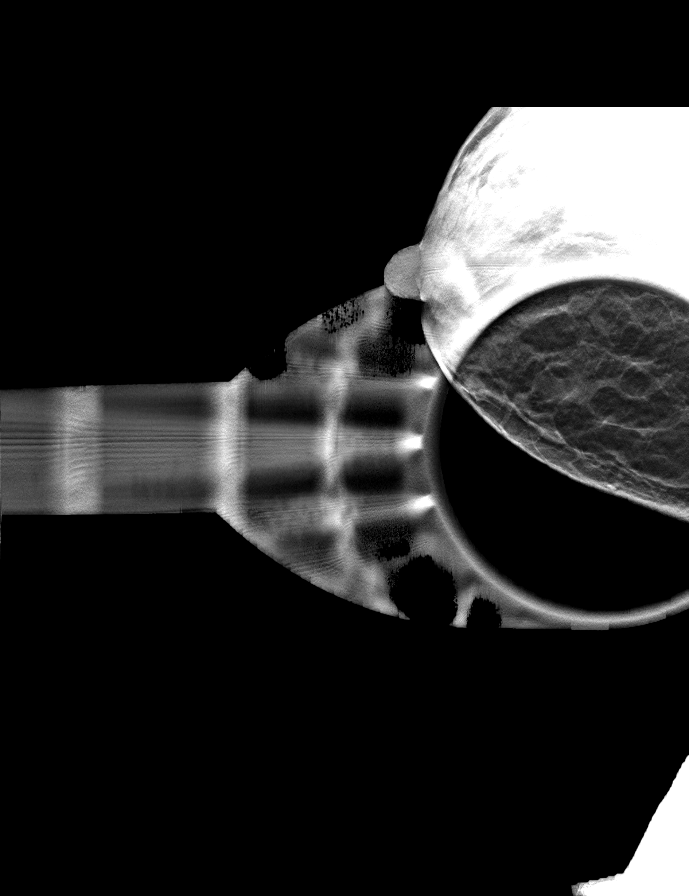

[R MLOID BREAST TOMOSYNTHESIS IMAGE tomo · tomo slice 9/16.0]
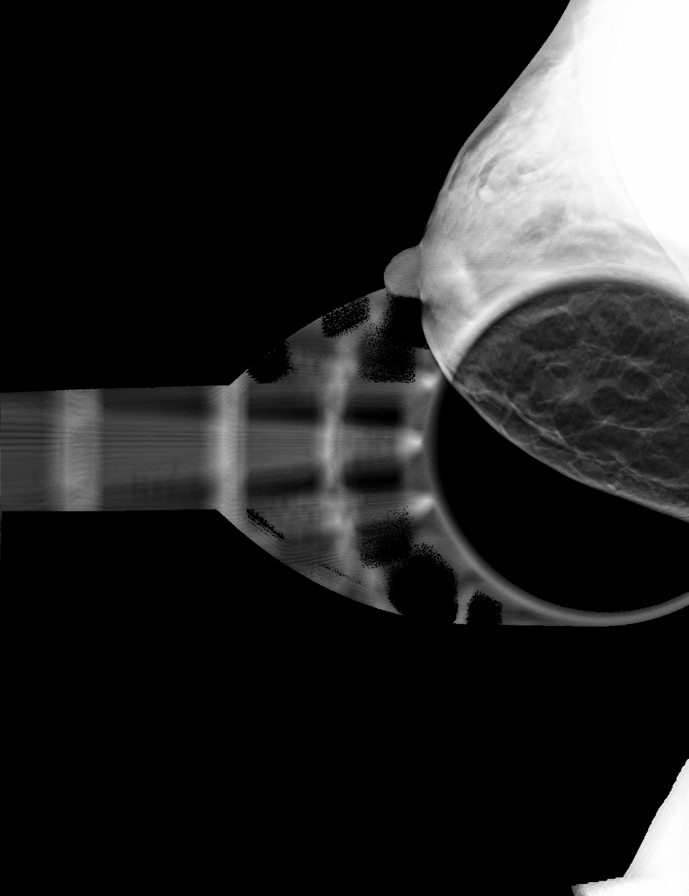

[R CCID BREAST TOMOSYNTHESIS IMAGE tomo (1 of 2) · tomo slice 9/18.0]
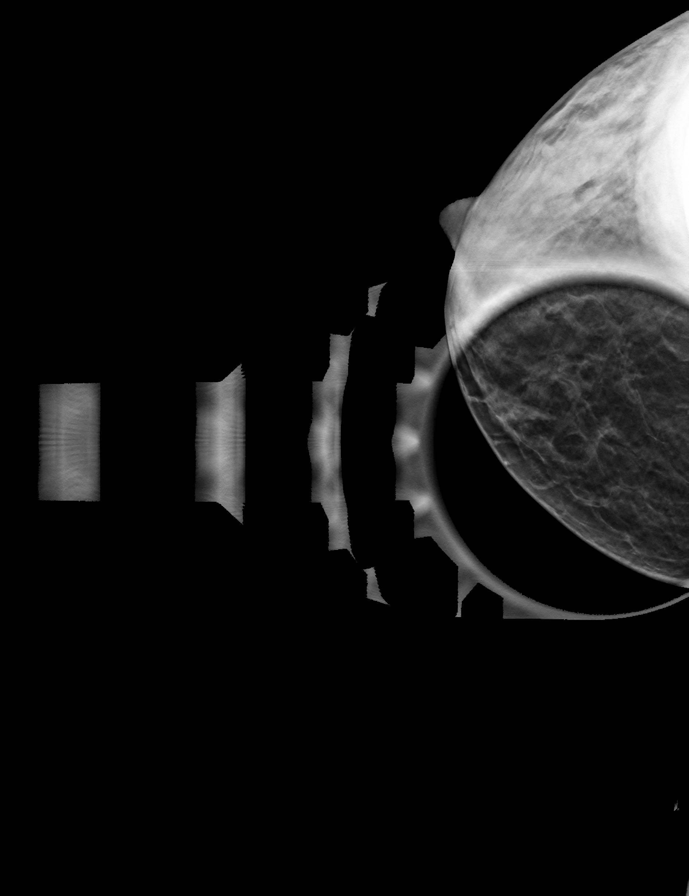

[R CCID BREAST TOMOSYNTHESIS IMAGE tomo (2 of 2) · tomo slice 10/19.0]
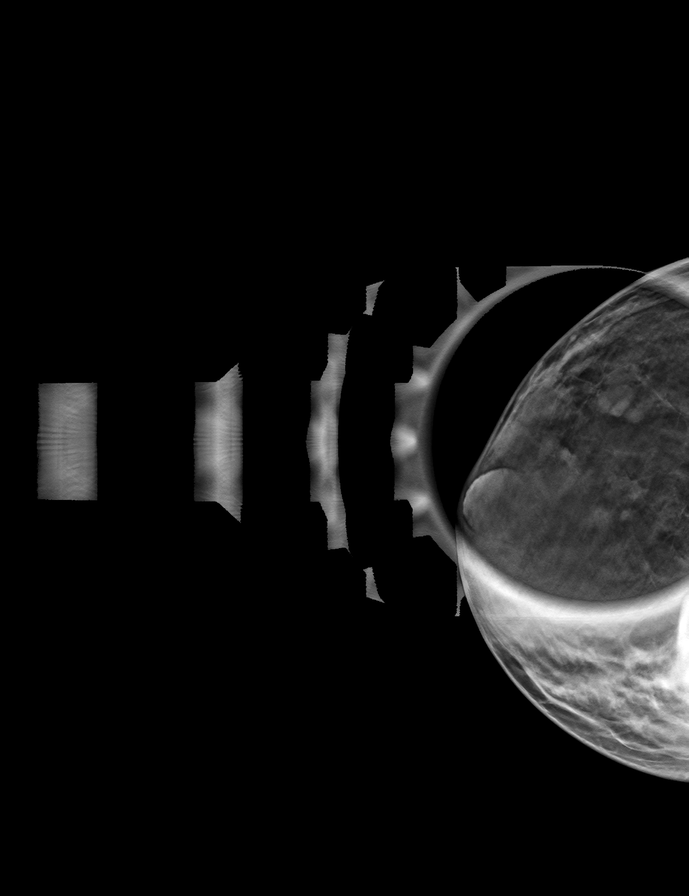

[R MLID BREAST TOMOSYNTHESIS IMAGE tomo · tomo slice 15/30.0]
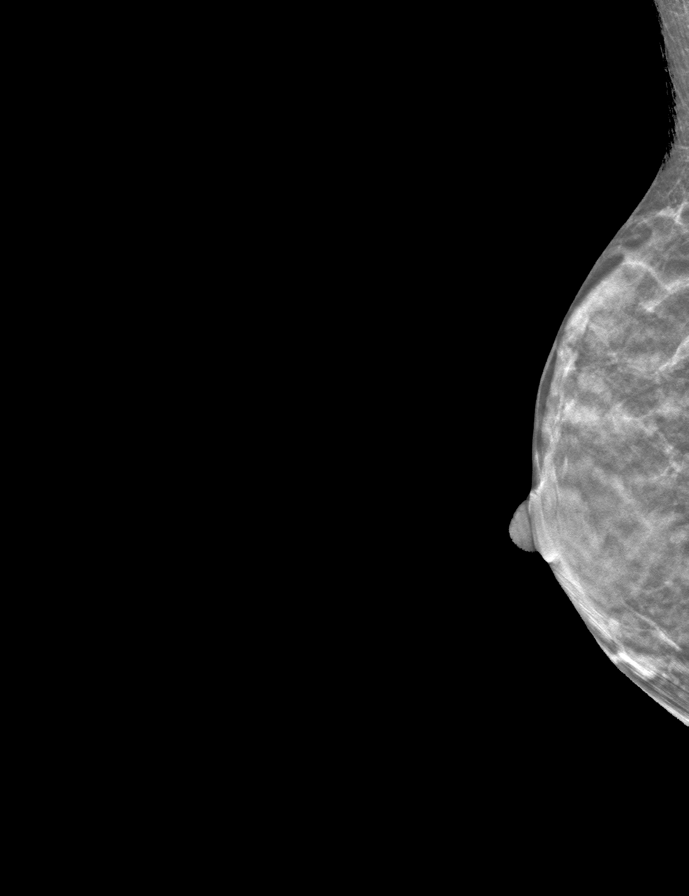

[8 of 24 positions shown; findings below may reference images not displayed]

ACR Breast Density Category d: The breast tissue is extremely dense,
which lowers the sensitivity of mammography.
FINDINGS: Spot compression tomosynthesis images through the lateral right
breast demonstrates a persistent lobulated obscured mass measuring
approximately 1.3 cm. In the lower-inner aspect of the right breast,
there is a focal asymmetry which may have interspersed macroscopic
fat. The patient has retropectoral implants.

Ultrasound targeted to the right breast at 6 o'clock, 2 cm from the
nipple demonstrates a heterogeneous oval hypoechoic mass measuring
1.1 x 0.3 x 0.8 cm. No blood flow seen within the mass on color
Doppler imaging. A similar-appearing mass is seen in the right
breast at 4 o'clock, 3 cm from the nipple measuring 0.7 x 0.3 x
cm. The location of this is more likely to correspond with the focal
asymmetry seen mammographically.

Ultrasound of the right breast at 10 o'clock, 2 cm from the nipple
demonstrates an anechoic oval circumscribed mass with thin internal
septations measuring 1.0 x 0.3 x 0.9 cm. Other smaller scattered
cysts are seen in the right breast.
IMPRESSION: 1. There are 2 likely benign masses in the right breast at 6 o'clock
and at 4 o'clock, favored to represent fat necrosis. Fibrocystic
changes are also possible. Scattered benign cysts are seen in the
right breast.

RECOMMENDATION:
Six-month follow-up diagnostic right breast mammogram and
ultrasound.

I have discussed the findings and recommendations with the patient.
If applicable, a reminder letter will be sent to the patient
regarding the next appointment.

BI-RADS CATEGORY  3: Probably benign.

## 2023-03-10 IMAGING — US US BREAST*R* LIMITED INC AXILLA
1 series · 13 of 16 positions shown · non-contrast
Comparison: Previous exam(s).

CLINICAL DATA: Screening recall for 2 right breast asymmetries. The
patient had implants placed in the [REDACTED] of 7577 after her last
mammogram.

EXAM:
DIGITAL DIAGNOSTIC UNILATERAL RIGHT MAMMOGRAM WITH IMPLANTS, CAD AND
TOMOSYNTHESIS; ULTRASOUND RIGHT BREAST LIMITED
TECHNIQUE: Right digital diagnostic mammography and breast tomosynthesis was
performed. The images were evaluated with computer-aided detection.
Standard and/or implant displaced views were performed.; Targeted
ultrasound examination of the right breast was performed

[Series 1: us breast*right* limited inc axilla · 0.06mm/px · 13 of 16 slices shown]
[im 1/16]
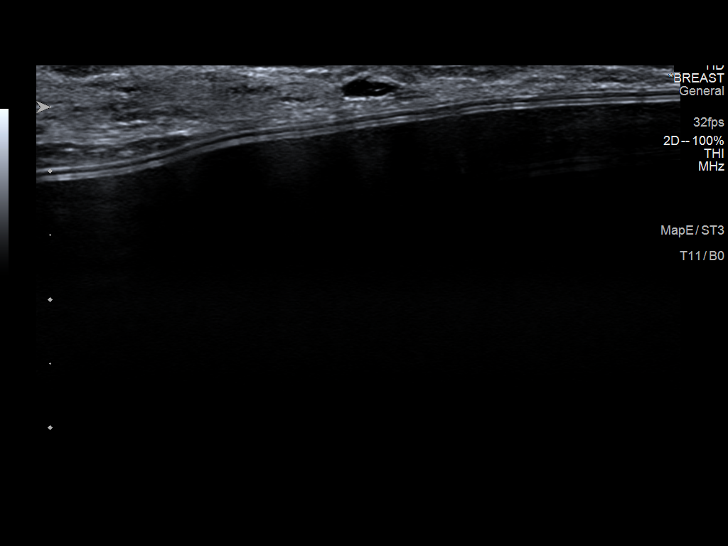
[im 2/16]
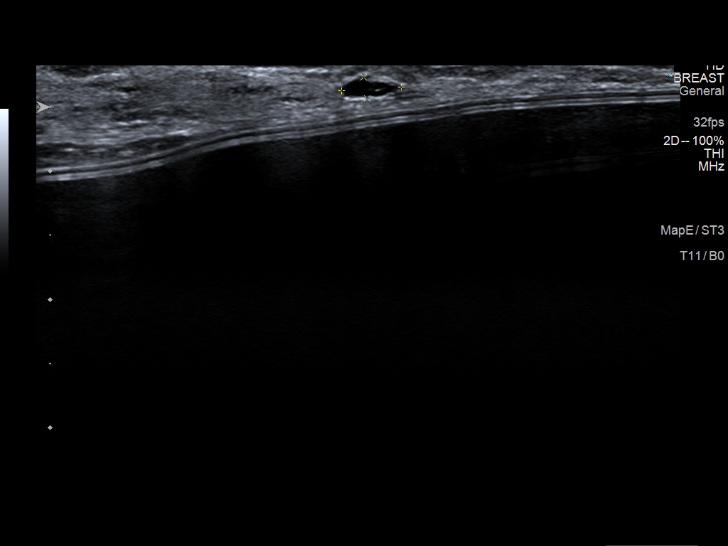
[im 4/16]
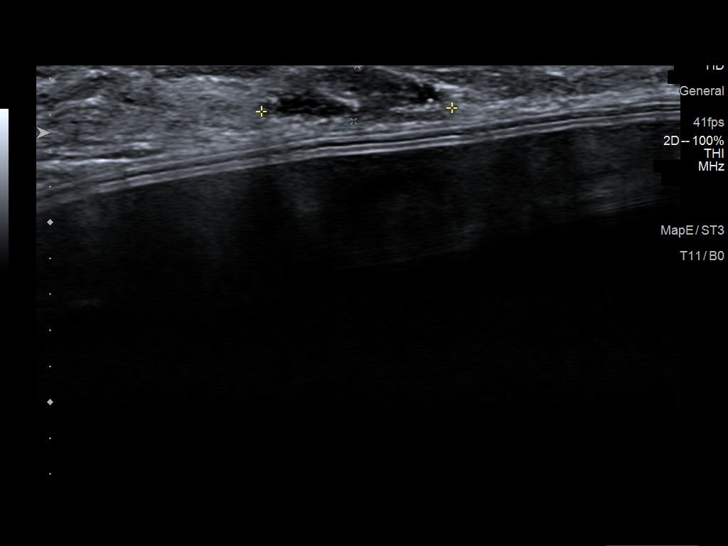
[im 5/16]
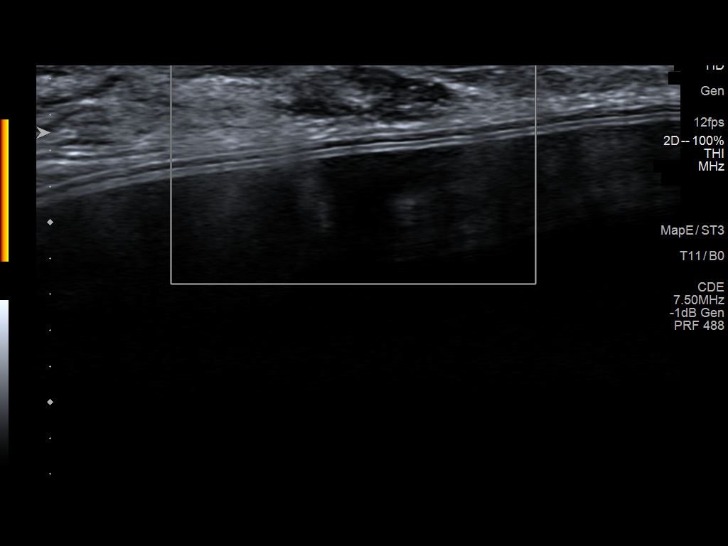
[im 6/16]
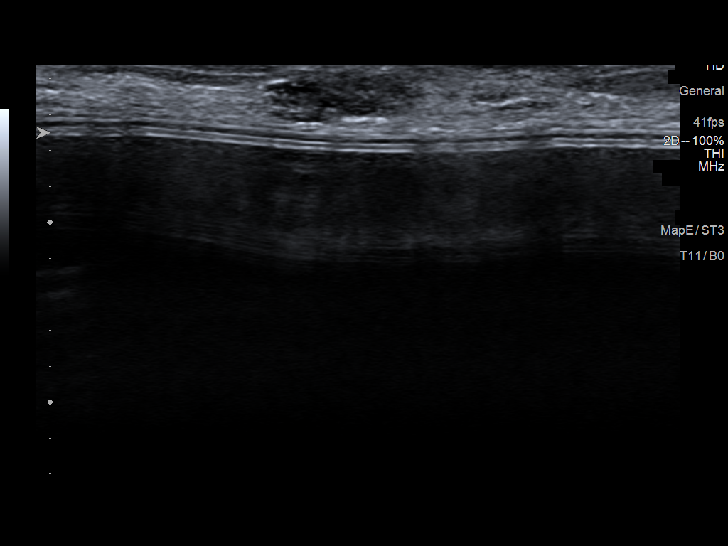
[im 7/16]
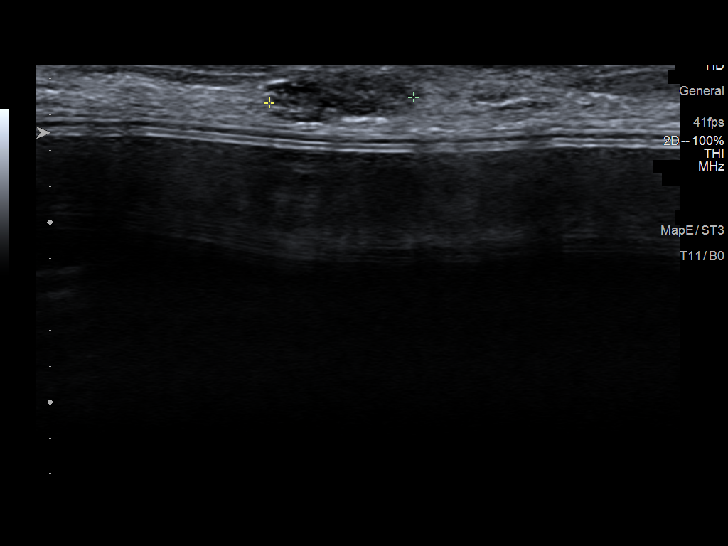
[im 9/16]
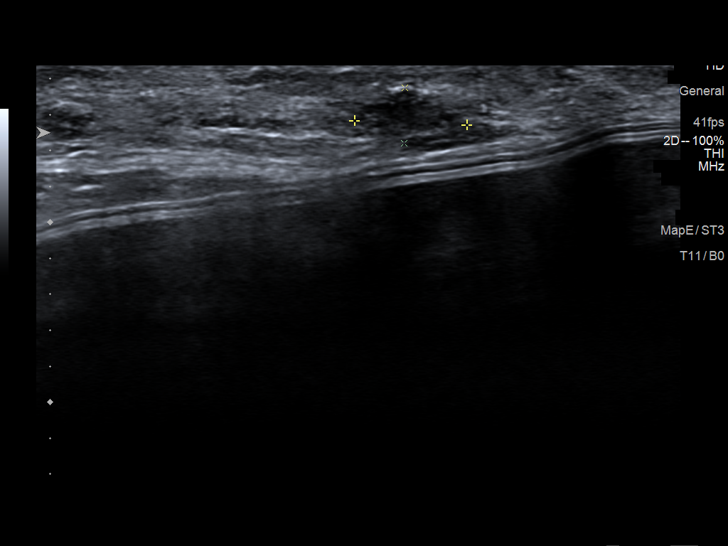
[im 10/16]
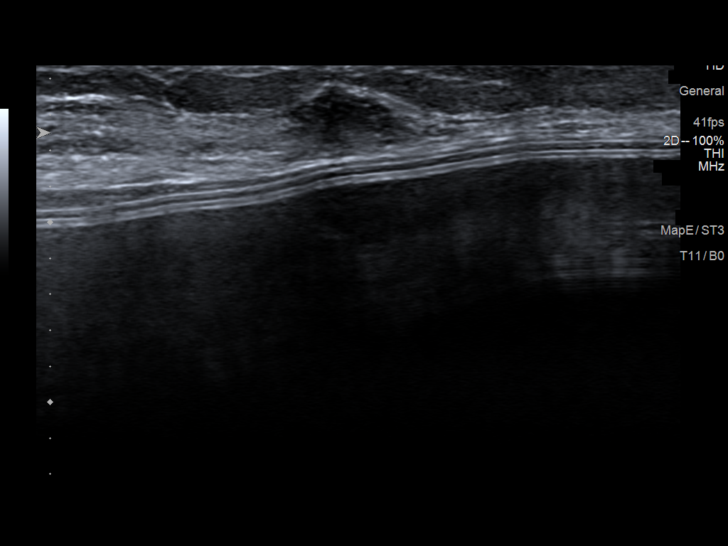
[im 11/16]
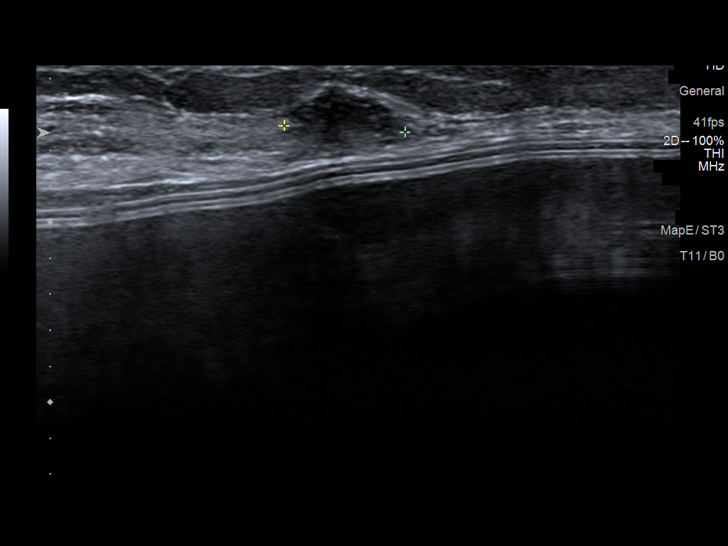
[im 12/16]
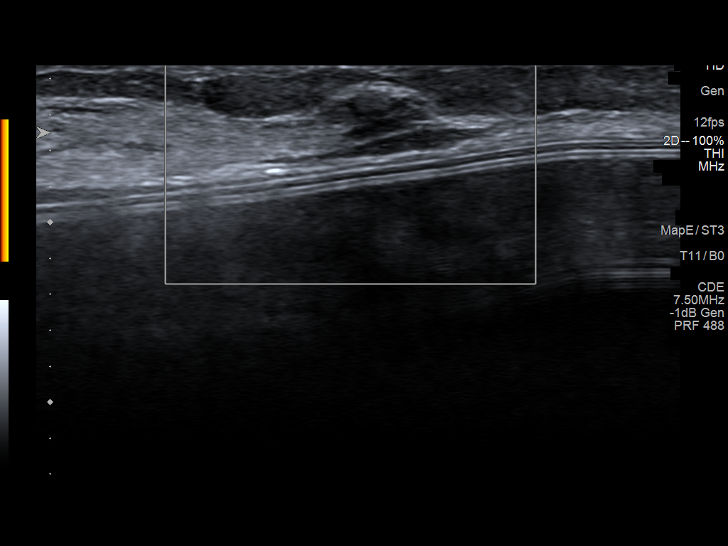
[im 13/16]
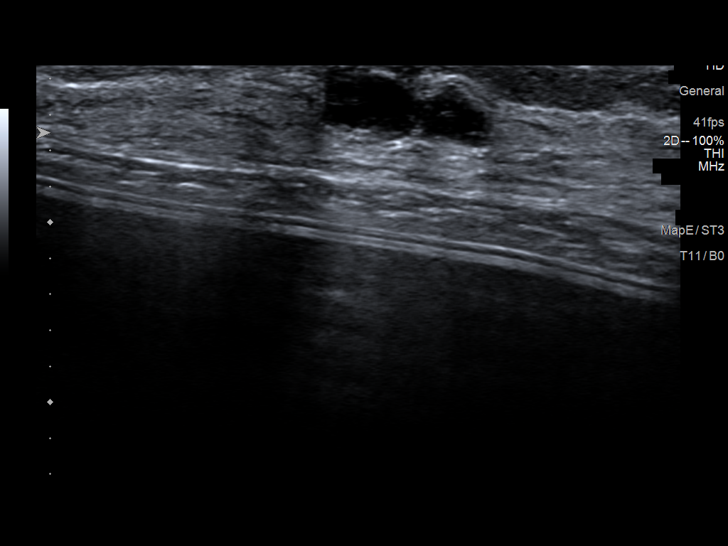
[im 15/16]
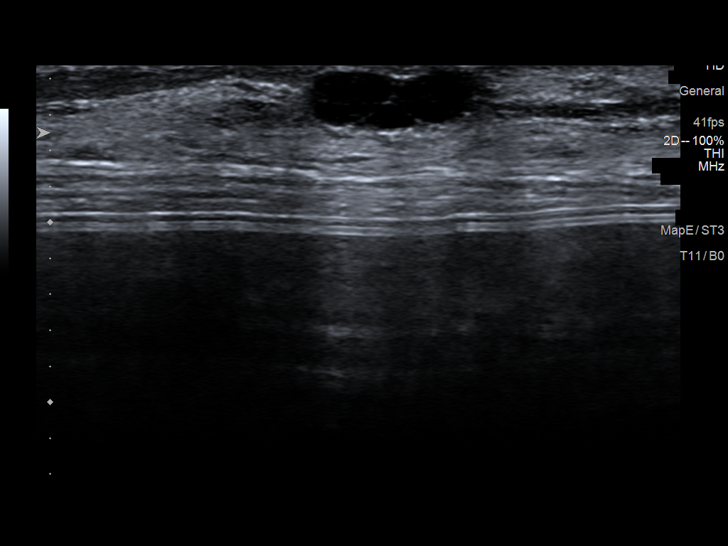
[im 16/16]
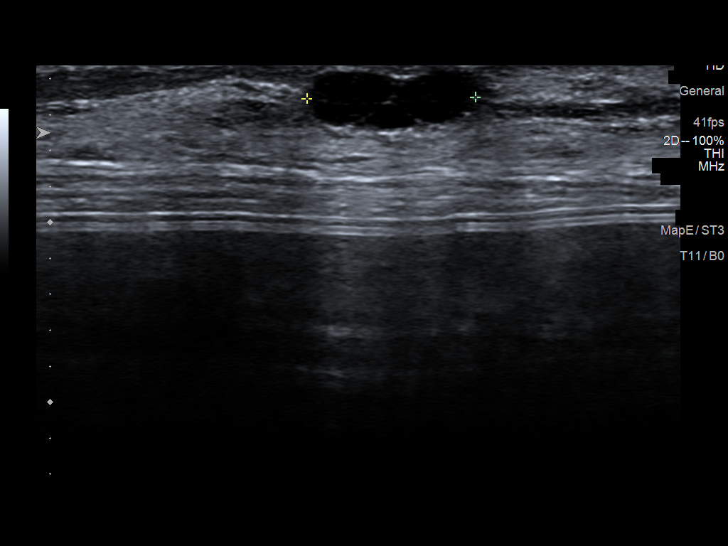

[13 of 16 positions shown; findings below may reference images not displayed]

ACR Breast Density Category d: The breast tissue is extremely dense,
which lowers the sensitivity of mammography.
FINDINGS: Spot compression tomosynthesis images through the lateral right
breast demonstrates a persistent lobulated obscured mass measuring
approximately 1.3 cm. In the lower-inner aspect of the right breast,
there is a focal asymmetry which may have interspersed macroscopic
fat. The patient has retropectoral implants.

Ultrasound targeted to the right breast at 6 o'clock, 2 cm from the
nipple demonstrates a heterogeneous oval hypoechoic mass measuring
1.1 x 0.3 x 0.8 cm. No blood flow seen within the mass on color
Doppler imaging. A similar-appearing mass is seen in the right
breast at 4 o'clock, 3 cm from the nipple measuring 0.7 x 0.3 x
cm. The location of this is more likely to correspond with the focal
asymmetry seen mammographically.

Ultrasound of the right breast at 10 o'clock, 2 cm from the nipple
demonstrates an anechoic oval circumscribed mass with thin internal
septations measuring 1.0 x 0.3 x 0.9 cm. Other smaller scattered
cysts are seen in the right breast.
IMPRESSION: 1. There are 2 likely benign masses in the right breast at 6 o'clock
and at 4 o'clock, favored to represent fat necrosis. Fibrocystic
changes are also possible. Scattered benign cysts are seen in the
right breast.

RECOMMENDATION:
Six-month follow-up diagnostic right breast mammogram and
ultrasound.

I have discussed the findings and recommendations with the patient.
If applicable, a reminder letter will be sent to the patient
regarding the next appointment.

BI-RADS CATEGORY  3: Probably benign.

## 2023-04-09 DIAGNOSIS — L219 Seborrheic dermatitis, unspecified: Secondary | ICD-10-CM | POA: Diagnosis not present

## 2023-04-09 DIAGNOSIS — D224 Melanocytic nevi of scalp and neck: Secondary | ICD-10-CM | POA: Diagnosis not present

## 2023-04-09 DIAGNOSIS — L821 Other seborrheic keratosis: Secondary | ICD-10-CM | POA: Diagnosis not present

## 2023-04-09 DIAGNOSIS — Z808 Family history of malignant neoplasm of other organs or systems: Secondary | ICD-10-CM | POA: Diagnosis not present

## 2023-04-09 DIAGNOSIS — D485 Neoplasm of uncertain behavior of skin: Secondary | ICD-10-CM | POA: Diagnosis not present

## 2023-04-09 DIAGNOSIS — D2221 Melanocytic nevi of right ear and external auricular canal: Secondary | ICD-10-CM | POA: Diagnosis not present

## 2023-05-14 DIAGNOSIS — F4320 Adjustment disorder, unspecified: Secondary | ICD-10-CM | POA: Diagnosis not present

## 2023-05-24 ENCOUNTER — Other Ambulatory Visit: Payer: BC Managed Care – PPO

## 2023-05-28 DIAGNOSIS — F4320 Adjustment disorder, unspecified: Secondary | ICD-10-CM | POA: Diagnosis not present

## 2023-05-31 ENCOUNTER — Ambulatory Visit
Admission: RE | Admit: 2023-05-31 | Discharge: 2023-05-31 | Disposition: A | Discharge status: Home / Self Care | Payer: BC Managed Care – PPO | Source: Ambulatory Visit | Attending: Obstetrics and Gynecology | Admitting: Obstetrics and Gynecology

## 2023-05-31 DIAGNOSIS — R922 Inconclusive mammogram: Secondary | ICD-10-CM | POA: Diagnosis not present

## 2023-05-31 DIAGNOSIS — N6001 Solitary cyst of right breast: Secondary | ICD-10-CM | POA: Diagnosis not present

## 2023-05-31 DIAGNOSIS — N63 Unspecified lump in unspecified breast: Secondary | ICD-10-CM | POA: Diagnosis not present

## 2023-05-31 DIAGNOSIS — R92342 Mammographic extreme density, left breast: Secondary | ICD-10-CM | POA: Diagnosis not present

## 2023-06-03 DIAGNOSIS — Z01419 Encounter for gynecological examination (general) (routine) without abnormal findings: Secondary | ICD-10-CM | POA: Diagnosis not present

## 2023-06-03 DIAGNOSIS — Z6823 Body mass index (BMI) 23.0-23.9, adult: Secondary | ICD-10-CM | POA: Diagnosis not present

## 2023-06-11 DIAGNOSIS — F4320 Adjustment disorder, unspecified: Secondary | ICD-10-CM | POA: Diagnosis not present

## 2023-07-08 DIAGNOSIS — F4320 Adjustment disorder, unspecified: Secondary | ICD-10-CM | POA: Diagnosis not present

## 2023-08-06 DIAGNOSIS — F4320 Adjustment disorder, unspecified: Secondary | ICD-10-CM | POA: Diagnosis not present

## 2023-10-12 DIAGNOSIS — N760 Acute vaginitis: Secondary | ICD-10-CM | POA: Diagnosis not present

## 2023-10-12 DIAGNOSIS — Z113 Encounter for screening for infections with a predominantly sexual mode of transmission: Secondary | ICD-10-CM | POA: Diagnosis not present

## 2023-10-12 DIAGNOSIS — N898 Other specified noninflammatory disorders of vagina: Secondary | ICD-10-CM | POA: Diagnosis not present

## 2023-10-13 DIAGNOSIS — F4389 Other reactions to severe stress: Secondary | ICD-10-CM | POA: Diagnosis not present

## 2023-11-10 DIAGNOSIS — F4389 Other reactions to severe stress: Secondary | ICD-10-CM | POA: Diagnosis not present

## 2023-11-30 DIAGNOSIS — F4389 Other reactions to severe stress: Secondary | ICD-10-CM | POA: Diagnosis not present

## 2023-12-29 ENCOUNTER — Ambulatory Visit: Payer: BC Managed Care – PPO | Admitting: Adult Health

## 2023-12-29 ENCOUNTER — Encounter: Payer: Self-pay | Admitting: Adult Health

## 2023-12-29 VITALS — BP 100/62 | HR 68 | Temp 98.1°F | Ht 63.5 in | Wt 135.0 lb

## 2023-12-29 DIAGNOSIS — J0100 Acute maxillary sinusitis, unspecified: Secondary | ICD-10-CM

## 2023-12-29 MED ORDER — DOXYCYCLINE HYCLATE 100 MG PO CAPS
100.0000 mg | ORAL_CAPSULE | Freq: Two times a day (BID) | ORAL | 0 refills | Status: DC
Start: 2023-12-29 — End: 2024-04-18

## 2023-12-29 NOTE — Progress Notes (Signed)
 Subjective:    Patient ID: Linda Fleming, female    DOB: October 19, 1976, 48 y.o.   MRN: [id number]  Sinusitis   48 year old female who  has a past medical history of Cancer ([facility]) (2009), Chicken pox, and HSV-2 (herpes simplex virus 2) infection.  She presents to the office today for an acute issue   She reports that over two weeks  has been experiencing sinus congestion with yellow nasal drainage and a productive cough with yellow sputum. She has not had much in the way of sinus pain or pressure. She denies fevers or chills.   She has been using OTC cough medication without relief.    Review of Systems See HPI   Past Medical History:  Diagnosis Date   Cancer ([facility]) 2009   Melanoma   Chicken pox    HSV-2 (herpes simplex virus 2) infection     Social History   Socioeconomic History   Marital status: Divorced    Spouse name: Not on file   Number of children: Not on file   Years of education: Not on file   Highest education level: Not on file  Occupational History   Not on file  Tobacco Use   Smoking status: Never    Passive exposure: Never   Smokeless tobacco: Never  Vaping Use   Vaping status: Never Used  Substance and Sexual Activity   Alcohol use: Yes    Alcohol/week: 1.0 standard drink of alcohol    Types: 1 Glasses of wine per week    Comment: occasional   Drug use: No   Sexual activity: Yes  Other Topics Concern   Not on file  Social History Narrative   She is working for a non profit    Two children       She likes to do crossfit    Social Drivers of Corporate Investment Banker Strain: Not on file  Food Insecurity: Not on file  Transportation Needs: Not on file  Physical Activity: Not on file  Stress: Not on file  Social Connections: Not on file  Intimate Partner Violence: Not on file    Past Surgical History:  Procedure Laterality Date   AUGMENTATION MAMMAPLASTY Bilateral 2022   LYMPH GLAND EXCISION  12/22/2007   MELANOMA EXCISION   12/22/2007    Family History  Problem Relation Age of Onset   Colon polyps Mother    Hyperlipidemia Mother    Cancer Maternal Aunt        melanoma   Crohn's disease Maternal Uncle    Heart disease Maternal Grandfather    Alzheimer's disease Paternal Grandmother    Diabetes Neg Hx    Stroke Neg Hx    Colon cancer Neg Hx    Esophageal cancer Neg Hx    Rectal cancer Neg Hx    Stomach cancer Neg Hx    Ulcerative colitis Neg Hx     No Known Allergies  Current Outpatient Medications on File Prior to Visit  Medication Sig Dispense Refill   acyclovir  (ZOVIRAX ) 400 MG tablet as needed.     Bacillus Coagulans-Inulin (PROBIOTIC) 1-250 BILLION-MG CAPS Probiotic     estradiol (ESTRACE) 2 MG tablet Take 2 mg by mouth daily.     Magnesium 250 MG TABS Take by mouth.     progesterone (PROMETRIUM) 100 MG capsule Take 2 capsules by mouth at bedtime.     VITAMIN D  PO Take 1,000 Int'l Units/day by mouth.  No current facility-administered medications on file prior to visit.    BP 100/62   Pulse 68   Temp 98.1 F (36.7 C) (Oral)   Ht 5' 3.5 (1.613 m)   Wt 135 lb (61.2 kg)   LMP 09/17/2018   SpO2 99%   BMI 23.54 kg/m       Objective:   Physical Exam Vitals and nursing note reviewed.  Constitutional:      Appearance: Normal appearance.  HENT:     Right Ear: Tympanic membrane, ear canal and external ear normal. There is no impacted cerumen.     Left Ear: Tympanic membrane, ear canal and external ear normal. There is no impacted cerumen.     Nose: Congestion and rhinorrhea present. Rhinorrhea is purulent.     Right Turbinates: Enlarged and swollen.     Left Turbinates: Enlarged and swollen.     Right Sinus: Maxillary sinus tenderness present.     Left Sinus: Maxillary sinus tenderness present.     Mouth/Throat:     Lips: Pink.     Mouth: Mucous membranes are moist.     Pharynx: Oropharynx is clear.  Cardiovascular:     Rate and Rhythm: Normal rate and regular rhythm.      Pulses: Normal pulses.     Heart sounds: Normal heart sounds.  Pulmonary:     Effort: Pulmonary effort is normal.     Breath sounds: Normal breath sounds.  Musculoskeletal:        General: Normal range of motion.  Skin:    General: Skin is warm and dry.     Capillary Refill: Capillary refill takes less than 2 seconds.  Neurological:     General: No focal deficit present.     Mental Status: She is alert and oriented to person, place, and time.  Psychiatric:        Mood and Affect: Mood normal.        Behavior: Behavior normal.        Thought Content: Thought content normal.        Judgment: Judgment normal.       Assessment & Plan:  1. Acute non-recurrent maxillary sinusitis (Primary) - Will treat due to symptoms and duration.  - Follow up if not resolved in the next 4- 5 days  - doxycycline  (VIBRAMYCIN ) 100 MG capsule; Take 1 capsule (100 mg total) by mouth 2 (two) times daily.  Dispense: 14 capsule; Refill: 0  Darleene Shape, NP

## 2024-01-14 DIAGNOSIS — F4389 Other reactions to severe stress: Secondary | ICD-10-CM | POA: Diagnosis not present

## 2024-01-26 ENCOUNTER — Encounter: Payer: BC Managed Care – PPO | Admitting: Adult Health

## 2024-01-31 DIAGNOSIS — F4389 Other reactions to severe stress: Secondary | ICD-10-CM | POA: Diagnosis not present

## 2024-02-10 DIAGNOSIS — F4389 Other reactions to severe stress: Secondary | ICD-10-CM | POA: Diagnosis not present

## 2024-02-21 DIAGNOSIS — F4389 Other reactions to severe stress: Secondary | ICD-10-CM | POA: Diagnosis not present

## 2024-03-02 ENCOUNTER — Encounter: Payer: BC Managed Care – PPO | Admitting: Adult Health

## 2024-03-24 DIAGNOSIS — F4389 Other reactions to severe stress: Secondary | ICD-10-CM | POA: Diagnosis not present

## 2024-04-18 ENCOUNTER — Encounter: Payer: Self-pay | Admitting: Adult Health

## 2024-04-18 ENCOUNTER — Ambulatory Visit (INDEPENDENT_AMBULATORY_CARE_PROVIDER_SITE_OTHER): Admitting: Adult Health

## 2024-04-18 VITALS — BP 92/60 | HR 69 | Temp 97.7°F | Ht 63.0 in | Wt 139.0 lb

## 2024-04-18 DIAGNOSIS — E785 Hyperlipidemia, unspecified: Secondary | ICD-10-CM | POA: Diagnosis not present

## 2024-04-18 DIAGNOSIS — Z0000 Encounter for general adult medical examination without abnormal findings: Secondary | ICD-10-CM | POA: Diagnosis not present

## 2024-04-18 DIAGNOSIS — B009 Herpesviral infection, unspecified: Secondary | ICD-10-CM | POA: Diagnosis not present

## 2024-04-18 LAB — COMPREHENSIVE METABOLIC PANEL WITH GFR
ALT: 12 U/L (ref 0–35)
AST: 17 U/L (ref 0–37)
Albumin: 4.5 g/dL (ref 3.5–5.2)
Alkaline Phosphatase: 32 U/L — ABNORMAL LOW (ref 39–117)
BUN: 21 mg/dL (ref 6–23)
CO2: 30 meq/L (ref 19–32)
Calcium: 9.4 mg/dL (ref 8.4–10.5)
Chloride: 101 meq/L (ref 96–112)
Creatinine, Ser: 1.04 mg/dL (ref 0.40–1.20)
GFR: 64.44 mL/min (ref >60.00)
Glucose, Bld: 81 mg/dL (ref 70–99)
Potassium: 4 meq/L (ref 3.5–5.1)
Sodium: 138 meq/L (ref 135–145)
Total Bilirubin: 0.4 mg/dL (ref 0.2–1.2)
Total Protein: 7 g/dL (ref 6.0–8.3)

## 2024-04-18 LAB — CBC WITH DIFFERENTIAL/PLATELET
Basophils Absolute: 0 10*3/uL (ref 0.0–0.1)
Basophils Relative: 0.7 % (ref 0.0–3.0)
Eosinophils Absolute: 0.1 10*3/uL (ref 0.0–0.7)
Eosinophils Relative: 1.2 % (ref 0.0–5.0)
HCT: 41.3 % (ref 36.0–46.0)
Hemoglobin: 14 g/dL (ref 12.0–15.0)
Lymphocytes Relative: 21.4 % (ref 12.0–46.0)
Lymphs Abs: 1.1 10*3/uL (ref 0.7–4.0)
MCHC: 33.8 g/dL (ref 30.0–36.0)
MCV: 91.7 fl (ref 78.0–100.0)
Monocytes Absolute: 0.3 10*3/uL (ref 0.1–1.0)
Monocytes Relative: 6.7 % (ref 3.0–12.0)
Neutro Abs: 3.5 10*3/uL (ref 1.4–7.7)
Neutrophils Relative %: 70 % (ref 43.0–77.0)
Platelets: 249 10*3/uL (ref 150.0–400.0)
RBC: 4.5 Mil/uL (ref 3.87–5.11)
RDW: 12.9 % (ref 11.5–15.5)
WBC: 4.9 10*3/uL (ref 4.0–10.5)

## 2024-04-18 LAB — LIPID PANEL
Cholesterol: 282 mg/dL — ABNORMAL HIGH (ref 0–200)
HDL: 107.6 mg/dL (ref >39.00)
LDL Cholesterol: 161 mg/dL — ABNORMAL HIGH (ref 0–99)
NonHDL: 174.39
Total CHOL/HDL Ratio: 3
Triglycerides: 69 mg/dL (ref 0.0–149.0)
VLDL: 13.8 mg/dL (ref 0.0–40.0)

## 2024-04-18 LAB — TSH: TSH: 2.12 u[IU]/mL (ref 0.35–5.50)

## 2024-04-18 NOTE — Progress Notes (Signed)
 Subjective:    Patient ID: Linda Fleming, female    DOB: [birth year], 48 y.o.   MRN: [id number]  HPI Patient presents for yearly preventative medicine examination. She is a pleasant 48 year old female who  has a past medical history of Cancer (HCC) (2009), Chicken pox, and HSV-2 (herpes simplex virus 2) infection.  HSV 2 - takes Acyclovir  400 mg BID PRN.   Hyperlipidemia - not currently on lipid lowering agent.  Lab Results  Component Value Date   CHOL 248 (H) 09/17/2022   HDL 100.40 09/17/2022   LDLCALC 136 (H) 09/17/2022   TRIG 55.0 09/17/2022   CHOLHDL 2 09/17/2022    All immunizations and health maintenance protocols were reviewed with the patient and needed orders were placed.  Appropriate screening laboratory values were ordered for the patient including screening of hyperlipidemia, renal function and hepatic function.  Medication reconciliation,  past medical history, social history, problem list and allergies were reviewed in detail with the patient  Goals were established with regard to weight loss, exercise, and  diet in compliance with medications. She continues to eat healthy and exercise.   She is up to date on routine colon cancer screening and mammograms. She will schedule her PAP.   Review of Systems  Constitutional: Negative.   HENT: Negative.    Eyes: Negative.   Respiratory: Negative.    Cardiovascular: Negative.   Gastrointestinal: Negative.   Endocrine: Negative.   Genitourinary: Negative.   Musculoskeletal: Negative.   Skin: Negative.   Allergic/Immunologic: Negative.   Neurological: Negative.   Hematological: Negative.   Psychiatric/Behavioral: Negative.     Past Medical History:  Diagnosis Date   Cancer (HCC) 2009   Melanoma   Chicken pox    HSV-2 (herpes simplex virus 2) infection     Social History   Socioeconomic History   Marital status: Divorced    Spouse name: Not on file   Number of children: Not on file   Years of  education: Not on file   Highest education level: Master's degree (e.g., MA, MS, MEng, MEd, MSW, MBA)  Occupational History   Not on file  Tobacco Use   Smoking status: Never    Passive exposure: Never   Smokeless tobacco: Never  Vaping Use   Vaping status: Never Used  Substance and Sexual Activity   Alcohol use: Yes    Alcohol/week: 1.0 standard drink of alcohol    Types: 1 Glasses of wine per week    Comment: occasional   Drug use: No   Sexual activity: Yes  Other Topics Concern   Not on file  Social History Narrative   She is working for a non profit    Two children       She likes to do crossfit    Social Drivers of Health   Financial Resource Strain: Low Risk  (04/17/2024)   Overall Financial Resource Strain (CARDIA)    Difficulty of Paying Living Expenses: Not very hard  Food Insecurity: No Food Insecurity (04/17/2024)   Hunger Vital Sign    Worried About Running Out of Food in the Last Year: Never true    Ran Out of Food in the Last Year: Never true  Transportation Needs: No Transportation Needs (04/17/2024)   PRAPARE - Administrator, Civil Service (Medical): No    Lack of Transportation (Non-Medical): No  Physical Activity: Sufficiently Active (04/17/2024)   Exercise Vital Sign    Days of Exercise per Week:  6 days    Minutes of Exercise per Session: 60 min  Stress: No Stress Concern Present (04/17/2024)   [employer] of Occupational Health - Occupational Stress Questionnaire    Feeling of Stress : Only a little  Social Connections: Moderately Isolated (04/17/2024)   Social Connection and Isolation Panel [NHANES]    Frequency of Communication with Friends and Family: More than three times a week    Frequency of Social Gatherings with Friends and Family: Three times a week    Attends Religious Services: 1 to 4 times per year    Active Member of Clubs or Organizations: No    Attends Engineer, structural: Not on file    Marital Status:  Divorced  Catering manager Violence: Not on file    Past Surgical History:  Procedure Laterality Date   AUGMENTATION MAMMAPLASTY Bilateral 2022   LYMPH GLAND EXCISION  12/22/2007   MELANOMA EXCISION  12/22/2007    Family History  Problem Relation Age of Onset   Colon polyps Mother    Hyperlipidemia Mother    Cancer Maternal Aunt        melanoma   Crohn's disease Maternal Uncle    Heart disease Maternal Grandfather    Alzheimer's disease Paternal Grandmother    Diabetes Neg Hx    Stroke Neg Hx    Colon cancer Neg Hx    Esophageal cancer Neg Hx    Rectal cancer Neg Hx    Stomach cancer Neg Hx    Ulcerative colitis Neg Hx     No Known Allergies  Current Outpatient Medications on File Prior to Visit  Medication Sig Dispense Refill   acyclovir  (ZOVIRAX ) 400 MG tablet as needed.     Bacillus Coagulans-Inulin (PROBIOTIC) 1-250 BILLION-MG CAPS Probiotic     estradiol (ESTRACE) 2 MG tablet Take 2 mg by mouth daily.     Magnesium 250 MG TABS Take by mouth.     progesterone (PROMETRIUM) 100 MG capsule Take 2 capsules by mouth at bedtime.     VITAMIN D  PO Take 1,000 Int'l Units/day by mouth.     No current facility-administered medications on file prior to visit.    BP 92/60   Pulse 69   Temp 97.7 F (36.5 C) (Oral)   Ht 5\' 3"  (1.6 m)   Wt 139 lb (63 kg)   LMP 09/17/2018   SpO2 97%   BMI 24.62 kg/m       Objective:   Physical Exam Vitals and nursing note reviewed.  Constitutional:      General: She is not in acute distress.    Appearance: Normal appearance. She is not ill-appearing.  HENT:     Head: Normocephalic and atraumatic.     Right Ear: Tympanic membrane, ear canal and external ear normal. There is no impacted cerumen.     Left Ear: Tympanic membrane, ear canal and external ear normal. There is no impacted cerumen.     Nose: Nose normal. No congestion or rhinorrhea.     Mouth/Throat:     Mouth: Mucous membranes are moist.     Pharynx: Oropharynx is  clear.  Eyes:     Extraocular Movements: Extraocular movements intact.     Conjunctiva/sclera: Conjunctivae normal.     Pupils: Pupils are equal, round, and reactive to light.  Neck:     Vascular: No carotid bruit.  Cardiovascular:     Rate and Rhythm: Normal rate and regular rhythm.     Pulses: Normal pulses.  Heart sounds: No murmur heard.    No friction rub. No gallop.  Pulmonary:     Effort: Pulmonary effort is normal.     Breath sounds: Normal breath sounds.  Abdominal:     General: Abdomen is flat. Bowel sounds are normal. There is no distension.     Palpations: Abdomen is soft. There is no mass.     Tenderness: There is no abdominal tenderness. There is no guarding or rebound.     Hernia: No hernia is present.  Musculoskeletal:        General: Normal range of motion.     Cervical back: Normal range of motion and neck supple.  Lymphadenopathy:     Cervical: No cervical adenopathy.  Skin:    General: Skin is warm and dry.     Capillary Refill: Capillary refill takes less than 2 seconds.  Neurological:     General: No focal deficit present.     Mental Status: She is alert and oriented to person, place, and time.  Psychiatric:        Mood and Affect: Mood normal.        Behavior: Behavior normal.        Thought Content: Thought content normal.        Judgment: Judgment normal.        Assessment & Plan:  1. Routine general medical examination at a health care facility (Primary) Today patient counseled on age appropriate routine health concerns for screening and prevention, each reviewed and up to date or declined. Immunizations reviewed and up to date or declined. Labs ordered and reviewed. Risk factors for depression reviewed and negative. Hearing function and visual acuity are intact. ADLs screened and addressed as needed. Functional ability and level of safety reviewed and appropriate. Education, counseling and referrals performed based on assessed risks today.  Patient provided with a copy of personalized plan for preventive services. - Continue to eat healthy and exercise - Follow up in one year or sooner if needed - Follow up with GYN   2. HSV-2 infection - Continue with Acyovir PRN   3. Hyperlipidemia, unspecified hyperlipidemia type - Consider statin and or CT Cardiac Scoring  - CBC with Differential/Platelet; Future - Comprehensive metabolic panel with GFR; Future - Lipid panel; Future - TSH; Future  Alto Atta, NP

## 2024-04-18 NOTE — Patient Instructions (Signed)
 It was great seeing you today   We will follow up with you regarding your lab work   Please let me know if you need anything

## 2024-04-21 DIAGNOSIS — D2222 Melanocytic nevi of left ear and external auricular canal: Secondary | ICD-10-CM | POA: Diagnosis not present

## 2024-04-21 DIAGNOSIS — L578 Other skin changes due to chronic exposure to nonionizing radiation: Secondary | ICD-10-CM | POA: Diagnosis not present

## 2024-04-21 DIAGNOSIS — Z85820 Personal history of malignant melanoma of skin: Secondary | ICD-10-CM | POA: Diagnosis not present

## 2024-04-21 DIAGNOSIS — L821 Other seborrheic keratosis: Secondary | ICD-10-CM | POA: Diagnosis not present

## 2024-05-05 DIAGNOSIS — F4389 Other reactions to severe stress: Secondary | ICD-10-CM | POA: Diagnosis not present

## 2024-05-19 DIAGNOSIS — F4389 Other reactions to severe stress: Secondary | ICD-10-CM | POA: Diagnosis not present

## 2024-06-08 ENCOUNTER — Ambulatory Visit: Payer: Self-pay | Admitting: Adult Health

## 2024-06-08 ENCOUNTER — Ambulatory Visit (HOSPITAL_BASED_OUTPATIENT_CLINIC_OR_DEPARTMENT_OTHER)
Admission: RE | Admit: 2024-06-08 | Discharge: 2024-06-08 | Disposition: A | Discharge status: Home / Self Care | Payer: Self-pay | Source: Ambulatory Visit | Attending: Adult Health | Admitting: Adult Health

## 2024-06-08 DIAGNOSIS — E785 Hyperlipidemia, unspecified: Secondary | ICD-10-CM | POA: Insufficient documentation

## 2024-06-27 DIAGNOSIS — F4389 Other reactions to severe stress: Secondary | ICD-10-CM | POA: Diagnosis not present

## 2024-07-04 ENCOUNTER — Other Ambulatory Visit: Payer: Self-pay | Admitting: Obstetrics and Gynecology

## 2024-07-04 DIAGNOSIS — N6310 Unspecified lump in the right breast, unspecified quadrant: Secondary | ICD-10-CM

## 2024-07-10 DIAGNOSIS — Z124 Encounter for screening for malignant neoplasm of cervix: Secondary | ICD-10-CM | POA: Diagnosis not present

## 2024-07-10 DIAGNOSIS — Z01419 Encounter for gynecological examination (general) (routine) without abnormal findings: Secondary | ICD-10-CM | POA: Diagnosis not present

## 2024-07-10 DIAGNOSIS — Z6822 Body mass index (BMI) 22.0-22.9, adult: Secondary | ICD-10-CM | POA: Diagnosis not present

## 2024-07-13 DIAGNOSIS — F4389 Other reactions to severe stress: Secondary | ICD-10-CM | POA: Diagnosis not present

## 2024-07-25 DIAGNOSIS — F4389 Other reactions to severe stress: Secondary | ICD-10-CM | POA: Diagnosis not present

## 2024-08-03 ENCOUNTER — Ambulatory Visit
Admission: RE | Admit: 2024-08-03 | Discharge: 2024-08-03 | Disposition: A | Discharge status: Home / Self Care | Source: Ambulatory Visit | Attending: Obstetrics and Gynecology | Admitting: Obstetrics and Gynecology

## 2024-08-03 DIAGNOSIS — N6310 Unspecified lump in the right breast, unspecified quadrant: Secondary | ICD-10-CM

## 2024-08-03 DIAGNOSIS — N6315 Unspecified lump in the right breast, overlapping quadrants: Secondary | ICD-10-CM | POA: Diagnosis not present

## 2024-08-03 DIAGNOSIS — R928 Other abnormal and inconclusive findings on diagnostic imaging of breast: Secondary | ICD-10-CM | POA: Diagnosis not present

## 2024-08-17 DIAGNOSIS — F4389 Other reactions to severe stress: Secondary | ICD-10-CM | POA: Diagnosis not present

## 2024-09-14 DIAGNOSIS — F4389 Other reactions to severe stress: Secondary | ICD-10-CM | POA: Diagnosis not present

## 2024-11-03 DIAGNOSIS — F4389 Other reactions to severe stress: Secondary | ICD-10-CM | POA: Diagnosis not present
